# Patient Record
Sex: Male | Born: 1955 | Race: White | Hispanic: No | Marital: Married | Smoking: Current every day smoker
Health system: Southern US, Community
[De-identification: ages and names within clinical notes are randomized; demographics above are authoritative.]

## PROBLEM LIST (undated history)

## (undated) DIAGNOSIS — M549 Dorsalgia, unspecified: Secondary | ICD-10-CM

## (undated) DIAGNOSIS — K5790 Diverticulosis of intestine, part unspecified, without perforation or abscess without bleeding: Secondary | ICD-10-CM

## (undated) DIAGNOSIS — G8929 Other chronic pain: Secondary | ICD-10-CM

## (undated) DIAGNOSIS — E785 Hyperlipidemia, unspecified: Secondary | ICD-10-CM

## (undated) DIAGNOSIS — M5417 Radiculopathy, lumbosacral region: Secondary | ICD-10-CM

## (undated) DIAGNOSIS — I10 Essential (primary) hypertension: Secondary | ICD-10-CM

## (undated) DIAGNOSIS — N400 Enlarged prostate without lower urinary tract symptoms: Secondary | ICD-10-CM

## (undated) DIAGNOSIS — M4800 Spinal stenosis, site unspecified: Secondary | ICD-10-CM

## (undated) DIAGNOSIS — Z87891 Personal history of nicotine dependence: Secondary | ICD-10-CM

## (undated) DIAGNOSIS — E119 Type 2 diabetes mellitus without complications: Secondary | ICD-10-CM

## (undated) HISTORY — DX: Spinal stenosis, site unspecified: M48.00

## (undated) HISTORY — DX: Radiculopathy, lumbosacral region: M54.17

## (undated) HISTORY — DX: Hyperlipidemia, unspecified: E78.5

## (undated) HISTORY — DX: Benign prostatic hyperplasia without lower urinary tract symptoms: N40.0

## (undated) HISTORY — DX: Diverticulosis of intestine, part unspecified, without perforation or abscess without bleeding: K57.90

## (undated) HISTORY — DX: Personal history of nicotine dependence: Z87.891

## (undated) HISTORY — DX: Essential (primary) hypertension: I10

## (undated) HISTORY — PX: COLONOSCOPY: SHX174

## (undated) HISTORY — DX: Other chronic pain: G89.29

## (undated) HISTORY — DX: Dorsalgia, unspecified: M54.9

## (undated) HISTORY — DX: Type 2 diabetes mellitus without complications: E11.9

---

## 1996-08-10 DIAGNOSIS — I10 Essential (primary) hypertension: Secondary | ICD-10-CM

## 1996-08-10 DIAGNOSIS — R030 Elevated blood-pressure reading, without diagnosis of hypertension: Secondary | ICD-10-CM

## 1996-08-10 HISTORY — DX: Essential (primary) hypertension: I10

## 1998-04-11 DIAGNOSIS — E119 Type 2 diabetes mellitus without complications: Secondary | ICD-10-CM

## 1998-04-11 DIAGNOSIS — E1169 Type 2 diabetes mellitus with other specified complication: Secondary | ICD-10-CM | POA: Insufficient documentation

## 1998-04-11 DIAGNOSIS — E1165 Type 2 diabetes mellitus with hyperglycemia: Secondary | ICD-10-CM

## 1998-04-11 HISTORY — DX: Type 2 diabetes mellitus without complications: E11.9

## 1998-04-26 ENCOUNTER — Encounter: Payer: Self-pay | Admitting: Family Medicine

## 1998-05-10 ENCOUNTER — Encounter: Payer: Self-pay | Admitting: Family Medicine

## 1998-05-10 LAB — CONVERTED CEMR LAB: Hgb A1c MFr Bld: 7.1 %

## 1999-09-11 ENCOUNTER — Encounter: Payer: Self-pay | Admitting: Family Medicine

## 1999-09-11 DIAGNOSIS — E785 Hyperlipidemia, unspecified: Secondary | ICD-10-CM

## 1999-09-11 HISTORY — DX: Hyperlipidemia, unspecified: E78.5

## 1999-09-11 LAB — CONVERTED CEMR LAB: Microalbumin U total vol: 20.5 mg/L

## 1999-12-12 ENCOUNTER — Encounter: Payer: Self-pay | Admitting: Family Medicine

## 2001-05-11 ENCOUNTER — Encounter: Payer: Self-pay | Admitting: Family Medicine

## 2001-07-11 ENCOUNTER — Encounter: Payer: Self-pay | Admitting: Family Medicine

## 2001-07-11 LAB — CONVERTED CEMR LAB: Microalbumin U total vol: 10.6 mg/L

## 2001-10-11 ENCOUNTER — Encounter: Payer: Self-pay | Admitting: Family Medicine

## 2001-10-11 LAB — CONVERTED CEMR LAB: Hgb A1c MFr Bld: 5.5 %

## 2004-10-28 ENCOUNTER — Ambulatory Visit: Payer: Self-pay | Admitting: Family Medicine

## 2006-04-22 ENCOUNTER — Ambulatory Visit: Payer: Self-pay | Admitting: Family Medicine

## 2006-04-23 ENCOUNTER — Encounter: Payer: Self-pay | Admitting: Family Medicine

## 2006-04-23 LAB — CONVERTED CEMR LAB
ALT: 25 units/L (ref 0–40)
AST: 18 units/L (ref 0–37)
Albumin: 4.1 g/dL (ref 3.5–5.2)
Alkaline Phosphatase: 79 units/L (ref 39–117)
BUN: 12 mg/dL (ref 6–23)
Basophils Absolute: 0 10*3/uL (ref 0.0–0.1)
Basophils Relative: 0 % (ref 0.0–1.0)
Bilirubin, Direct: 0.1 mg/dL (ref 0.0–0.3)
CO2: 30 meq/L (ref 19–32)
Calcium: 9.4 mg/dL (ref 8.4–10.5)
Chloride: 102 meq/L (ref 96–112)
Cholesterol: 239 mg/dL (ref 0–200)
Creatinine, Ser: 0.8 mg/dL (ref 0.4–1.5)
Creatinine,U: 150.4 mg/dL
Direct LDL: 170.6 mg/dL
Eosinophils Absolute: 0.3 10*3/uL (ref 0.0–0.6)
Eosinophils Relative: 4.1 % (ref 0.0–5.0)
GFR calc Af Amer: 133 mL/min
GFR calc non Af Amer: 110 mL/min
Glucose, Bld: 197 mg/dL — ABNORMAL HIGH (ref 70–99)
HCT: 46.6 % (ref 39.0–52.0)
HDL: 45.7 mg/dL (ref >39.0)
Hemoglobin: 16.2 g/dL (ref 13.0–17.0)
Hgb A1c MFr Bld: 10.5 %
Hgb A1c MFr Bld: 10.5 % — ABNORMAL HIGH (ref 4.6–6.0)
Lymphocytes Relative: 23.1 % (ref 12.0–46.0)
MCHC: 34.7 g/dL (ref 30.0–36.0)
MCV: 88.4 fL (ref 78.0–100.0)
Microalb Creat Ratio: 55.9 mg/g — ABNORMAL HIGH (ref 0.0–30.0)
Microalb, Ur: 8.4 mg/dL — ABNORMAL HIGH (ref 0.0–1.9)
Monocytes Absolute: 0.4 10*3/uL (ref 0.2–0.7)
Monocytes Relative: 5.7 % (ref 3.0–11.0)
Neutro Abs: 5.3 10*3/uL (ref 1.4–7.7)
Neutrophils Relative %: 67.1 % (ref 43.0–77.0)
Platelets: 308 10*3/uL (ref 150–400)
Potassium: 4.1 meq/L (ref 3.5–5.1)
RBC: 5.28 M/uL (ref 4.22–5.81)
RDW: 12 % (ref 11.5–14.6)
Sodium: 140 meq/L (ref 135–145)
TSH: 1.42 microintl units/mL (ref 0.35–5.50)
Total Bilirubin: 0.7 mg/dL (ref 0.3–1.2)
Total CHOL/HDL Ratio: 5.2
Total Protein: 7.3 g/dL (ref 6.0–8.3)
Triglycerides: 117 mg/dL (ref 0–149)
VLDL: 23 mg/dL (ref 0–40)
WBC: 7.8 10*3/uL (ref 4.5–10.5)

## 2006-05-29 ENCOUNTER — Ambulatory Visit: Payer: Self-pay | Admitting: Family Medicine

## 2006-06-12 ENCOUNTER — Encounter: Payer: Self-pay | Admitting: Family Medicine

## 2006-06-19 DIAGNOSIS — H5310 Unspecified subjective visual disturbances: Secondary | ICD-10-CM | POA: Insufficient documentation

## 2006-07-24 ENCOUNTER — Telehealth (INDEPENDENT_AMBULATORY_CARE_PROVIDER_SITE_OTHER): Payer: Self-pay | Admitting: *Deleted

## 2006-07-29 ENCOUNTER — Ambulatory Visit: Payer: Self-pay | Admitting: Family Medicine

## 2006-07-29 ENCOUNTER — Encounter (INDEPENDENT_AMBULATORY_CARE_PROVIDER_SITE_OTHER): Payer: Self-pay | Admitting: Internal Medicine

## 2006-08-06 ENCOUNTER — Telehealth (INDEPENDENT_AMBULATORY_CARE_PROVIDER_SITE_OTHER): Payer: Self-pay | Admitting: Internal Medicine

## 2006-08-06 LAB — CONVERTED CEMR LAB
AST: 20 units/L (ref 0–37)
Cholesterol: 164 mg/dL (ref 0–200)
HDL: 46.8 mg/dL (ref >39.0)

## 2006-09-17 ENCOUNTER — Ambulatory Visit: Payer: Self-pay | Admitting: Family Medicine

## 2006-09-29 ENCOUNTER — Ambulatory Visit: Payer: Self-pay | Admitting: Family Medicine

## 2006-10-07 LAB — CONVERTED CEMR LAB: Hgb A1c MFr Bld: 8.8 % — ABNORMAL HIGH (ref 4.6–6.0)

## 2006-10-14 ENCOUNTER — Telehealth (INDEPENDENT_AMBULATORY_CARE_PROVIDER_SITE_OTHER): Payer: Self-pay | Admitting: *Deleted

## 2006-12-11 ENCOUNTER — Ambulatory Visit: Payer: Self-pay | Admitting: Family Medicine

## 2006-12-11 DIAGNOSIS — R635 Abnormal weight gain: Secondary | ICD-10-CM | POA: Insufficient documentation

## 2007-01-11 ENCOUNTER — Encounter (INDEPENDENT_AMBULATORY_CARE_PROVIDER_SITE_OTHER): Payer: Self-pay | Admitting: *Deleted

## 2007-07-06 ENCOUNTER — Telehealth (INDEPENDENT_AMBULATORY_CARE_PROVIDER_SITE_OTHER): Payer: Self-pay | Admitting: Internal Medicine

## 2007-09-23 ENCOUNTER — Telehealth (INDEPENDENT_AMBULATORY_CARE_PROVIDER_SITE_OTHER): Payer: Self-pay | Admitting: Internal Medicine

## 2007-10-12 ENCOUNTER — Ambulatory Visit: Payer: Self-pay | Admitting: Family Medicine

## 2007-10-22 ENCOUNTER — Ambulatory Visit: Payer: Self-pay | Admitting: Family Medicine

## 2007-10-26 ENCOUNTER — Encounter (INDEPENDENT_AMBULATORY_CARE_PROVIDER_SITE_OTHER): Payer: Self-pay | Admitting: Internal Medicine

## 2007-10-26 LAB — CONVERTED CEMR LAB
ALT: 31 units/L (ref 0–53)
AST: 22 units/L (ref 0–37)
BUN: 12 mg/dL (ref 6–23)
CO2: 31 meq/L (ref 19–32)
Calcium: 9.4 mg/dL (ref 8.4–10.5)
Creatinine, Ser: 0.9 mg/dL (ref 0.4–1.5)
Glucose, Bld: 152 mg/dL — ABNORMAL HIGH (ref 70–99)
HDL: 37 mg/dL — ABNORMAL LOW (ref >39.0)
Microalb, Ur: 1.1 mg/dL (ref 0.0–1.9)
TSH: 2.4 microintl units/mL (ref 0.35–5.50)
Triglycerides: 59 mg/dL (ref 0–149)

## 2007-11-23 ENCOUNTER — Ambulatory Visit: Payer: Self-pay | Admitting: Family Medicine

## 2008-02-11 DIAGNOSIS — G8929 Other chronic pain: Secondary | ICD-10-CM

## 2008-02-11 HISTORY — PX: OTHER SURGICAL HISTORY: SHX169

## 2008-02-11 HISTORY — DX: Other chronic pain: G89.29

## 2008-02-23 ENCOUNTER — Ambulatory Visit: Payer: Self-pay | Admitting: Family Medicine

## 2008-02-23 DIAGNOSIS — G8929 Other chronic pain: Secondary | ICD-10-CM | POA: Insufficient documentation

## 2008-02-23 DIAGNOSIS — IMO0002 Reserved for concepts with insufficient information to code with codable children: Secondary | ICD-10-CM

## 2008-03-02 ENCOUNTER — Encounter: Payer: Self-pay | Admitting: Family Medicine

## 2008-03-07 ENCOUNTER — Ambulatory Visit: Payer: Self-pay | Admitting: Family Medicine

## 2008-03-31 ENCOUNTER — Ambulatory Visit: Payer: Self-pay | Admitting: Family Medicine

## 2008-04-07 ENCOUNTER — Encounter (INDEPENDENT_AMBULATORY_CARE_PROVIDER_SITE_OTHER): Payer: Self-pay | Admitting: Internal Medicine

## 2008-04-07 ENCOUNTER — Encounter: Payer: Self-pay | Admitting: Family Medicine

## 2008-04-11 ENCOUNTER — Encounter: Payer: Self-pay | Admitting: Family Medicine

## 2008-04-24 ENCOUNTER — Encounter: Payer: Self-pay | Admitting: Family Medicine

## 2008-04-25 ENCOUNTER — Encounter (INDEPENDENT_AMBULATORY_CARE_PROVIDER_SITE_OTHER): Payer: Self-pay | Admitting: *Deleted

## 2008-04-28 ENCOUNTER — Encounter: Payer: Self-pay | Admitting: Family Medicine

## 2008-05-05 ENCOUNTER — Encounter: Payer: Self-pay | Admitting: Family Medicine

## 2008-05-10 ENCOUNTER — Encounter: Payer: Self-pay | Admitting: Family Medicine

## 2008-05-18 ENCOUNTER — Encounter: Payer: Self-pay | Admitting: Family Medicine

## 2008-05-19 ENCOUNTER — Encounter: Payer: Self-pay | Admitting: Family Medicine

## 2008-05-29 ENCOUNTER — Encounter: Payer: Self-pay | Admitting: Family Medicine

## 2008-06-09 ENCOUNTER — Telehealth (INDEPENDENT_AMBULATORY_CARE_PROVIDER_SITE_OTHER): Payer: Self-pay | Admitting: *Deleted

## 2008-07-03 ENCOUNTER — Encounter (INDEPENDENT_AMBULATORY_CARE_PROVIDER_SITE_OTHER): Payer: Self-pay | Admitting: Internal Medicine

## 2008-07-04 ENCOUNTER — Telehealth (INDEPENDENT_AMBULATORY_CARE_PROVIDER_SITE_OTHER): Payer: Self-pay | Admitting: Internal Medicine

## 2008-07-18 ENCOUNTER — Encounter (INDEPENDENT_AMBULATORY_CARE_PROVIDER_SITE_OTHER): Payer: Self-pay | Admitting: Internal Medicine

## 2008-07-18 ENCOUNTER — Ambulatory Visit: Payer: Self-pay | Admitting: Family Medicine

## 2008-07-18 DIAGNOSIS — H6120 Impacted cerumen, unspecified ear: Secondary | ICD-10-CM

## 2008-07-18 LAB — CONVERTED CEMR LAB
Glucose, Urine, Semiquant: NEGATIVE
WBC Urine, dipstick: NEGATIVE
pH: 5

## 2008-07-28 ENCOUNTER — Encounter: Payer: Self-pay | Admitting: Family Medicine

## 2008-07-28 LAB — CONVERTED CEMR LAB
CO2: 30 meq/L (ref 19–32)
Chloride: 107 meq/L (ref 96–112)
Creatinine, Ser: 0.8 mg/dL (ref 0.4–1.5)
HDL: 53.6 mg/dL (ref >39.00)
LDL Cholesterol: 63 mg/dL (ref 0–99)
Microalb Creat Ratio: 12.7 mg/g (ref 0.0–30.0)
TSH: 1.56 microintl units/mL (ref 0.35–5.50)
Total CHOL/HDL Ratio: 2
Triglycerides: 62 mg/dL (ref 0.0–149.0)
VLDL: 12.4 mg/dL (ref 0.0–40.0)

## 2008-08-02 ENCOUNTER — Ambulatory Visit: Payer: Self-pay | Admitting: Family Medicine

## 2008-10-02 ENCOUNTER — Ambulatory Visit: Payer: Self-pay | Admitting: Family Medicine

## 2008-10-03 ENCOUNTER — Encounter (INDEPENDENT_AMBULATORY_CARE_PROVIDER_SITE_OTHER): Payer: Self-pay | Admitting: Internal Medicine

## 2009-01-08 ENCOUNTER — Encounter (INDEPENDENT_AMBULATORY_CARE_PROVIDER_SITE_OTHER): Payer: Self-pay | Admitting: Internal Medicine

## 2009-01-19 ENCOUNTER — Ambulatory Visit: Payer: Self-pay | Admitting: Family Medicine

## 2009-01-24 LAB — CONVERTED CEMR LAB
HDL: 44.7 mg/dL (ref >39.00)
LDL Cholesterol: 69 mg/dL (ref 0–99)
Total CHOL/HDL Ratio: 3
Triglycerides: 62 mg/dL (ref 0.0–149.0)
VLDL: 12.4 mg/dL (ref 0.0–40.0)

## 2009-07-27 ENCOUNTER — Ambulatory Visit: Payer: Self-pay | Admitting: Family Medicine

## 2009-07-30 LAB — CONVERTED CEMR LAB
Cholesterol: 152 mg/dL (ref 0–200)
HDL: 51.6 mg/dL (ref >39.00)
Microalb Creat Ratio: 0.9 mg/g (ref 0.0–30.0)
Triglycerides: 54 mg/dL (ref 0.0–149.0)
VLDL: 10.8 mg/dL (ref 0.0–40.0)

## 2009-10-11 ENCOUNTER — Encounter (INDEPENDENT_AMBULATORY_CARE_PROVIDER_SITE_OTHER): Payer: Self-pay | Admitting: *Deleted

## 2009-11-20 ENCOUNTER — Ambulatory Visit: Payer: Self-pay | Admitting: Internal Medicine

## 2009-11-20 DIAGNOSIS — F17200 Nicotine dependence, unspecified, uncomplicated: Secondary | ICD-10-CM

## 2009-11-20 LAB — CONVERTED CEMR LAB
HDL goal, serum: 40 mg/dL
LDL Goal: 100 mg/dL

## 2010-02-20 ENCOUNTER — Other Ambulatory Visit: Payer: Self-pay | Admitting: Family Medicine

## 2010-02-20 ENCOUNTER — Ambulatory Visit
Admission: RE | Admit: 2010-02-20 | Discharge: 2010-02-20 | Payer: Self-pay | Source: Home / Self Care | Attending: Family Medicine | Admitting: Family Medicine

## 2010-02-20 LAB — HEPATIC FUNCTION PANEL
ALT: 29 U/L (ref 0–53)
AST: 22 U/L (ref 0–37)
Albumin: 4 g/dL (ref 3.5–5.2)
Alkaline Phosphatase: 59 U/L (ref 39–117)
Bilirubin, Direct: 0.1 mg/dL (ref 0.0–0.3)
Total Bilirubin: 0.8 mg/dL (ref 0.3–1.2)
Total Protein: 6.8 g/dL (ref 6.0–8.3)

## 2010-02-20 LAB — LDL CHOLESTEROL, DIRECT: Direct LDL: 122.6 mg/dL

## 2010-02-20 LAB — BASIC METABOLIC PANEL
BUN: 12 mg/dL (ref 6–23)
CO2: 29 mEq/L (ref 19–32)
Calcium: 9.5 mg/dL (ref 8.4–10.5)
Chloride: 105 mEq/L (ref 96–112)
Creatinine, Ser: 0.8 mg/dL (ref 0.4–1.5)
GFR: 116.16 mL/min (ref >60.00)
Glucose, Bld: 146 mg/dL — ABNORMAL HIGH (ref 70–99)
Potassium: 5.3 mEq/L — ABNORMAL HIGH (ref 3.5–5.1)
Sodium: 141 mEq/L (ref 135–145)

## 2010-02-20 LAB — HEMOGLOBIN A1C: Hgb A1c MFr Bld: 6.7 % — ABNORMAL HIGH (ref 4.6–6.5)

## 2010-02-26 ENCOUNTER — Other Ambulatory Visit: Payer: Self-pay | Admitting: Family Medicine

## 2010-02-26 ENCOUNTER — Ambulatory Visit
Admission: RE | Admit: 2010-02-26 | Discharge: 2010-02-26 | Payer: Self-pay | Source: Home / Self Care | Attending: Family Medicine | Admitting: Family Medicine

## 2010-02-26 DIAGNOSIS — E875 Hyperkalemia: Secondary | ICD-10-CM | POA: Insufficient documentation

## 2010-02-26 LAB — POTASSIUM: Potassium: 5.4 mEq/L — ABNORMAL HIGH (ref 3.5–5.1)

## 2010-03-07 ENCOUNTER — Other Ambulatory Visit: Payer: Self-pay | Admitting: Family Medicine

## 2010-03-07 ENCOUNTER — Ambulatory Visit
Admission: RE | Admit: 2010-03-07 | Discharge: 2010-03-07 | Payer: Self-pay | Source: Home / Self Care | Attending: Family Medicine | Admitting: Family Medicine

## 2010-03-07 ENCOUNTER — Encounter: Payer: Self-pay | Admitting: Family Medicine

## 2010-03-07 DIAGNOSIS — N400 Enlarged prostate without lower urinary tract symptoms: Secondary | ICD-10-CM | POA: Insufficient documentation

## 2010-03-07 LAB — BASIC METABOLIC PANEL
BUN: 14 mg/dL (ref 6–23)
CO2: 29 mEq/L (ref 19–32)
Calcium: 9.5 mg/dL (ref 8.4–10.5)
Chloride: 102 mEq/L (ref 96–112)
Creatinine, Ser: 0.9 mg/dL (ref 0.4–1.5)
GFR: 92.91 mL/min (ref >60.00)
Glucose, Bld: 129 mg/dL — ABNORMAL HIGH (ref 70–99)
Potassium: 5.1 mEq/L (ref 3.5–5.1)
Sodium: 137 mEq/L (ref 135–145)

## 2010-03-07 LAB — PSA: PSA: 0.59 ng/mL (ref 0.10–4.00)

## 2010-03-07 LAB — HM DIABETES FOOT EXAM
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
## 2010-03-13 NOTE — Miscellaneous (Signed)
Summary: med list update- test strips  Clinical Lists Changes  Medications: Changed medication from FREESTYLE LITE TEST  STRP (GLUCOSE BLOOD) check blood glucose one to two times a day to [facility] ULTRA TEST  STRP (GLUCOSE BLOOD) check blood sugar one to two times daily     Prior Medications: ZOCOR 20 MG  TABS (SIMVASTATIN) Take 1 tablet by mouth once a day ONETOUCH ULTRA TEST  STRP (GLUCOSE BLOOD) check blood sugar one to two times daily ACTOPLUS MET 15-850 MG TABS (PIOGLITAZONE HCL-METFORMIN HCL) 1 tab two times a day for diabetes by mouth ALEVE 220 MG TABS (NAPROXEN SODIUM) OTC as directed TOPAMAX 25 MG TABS (TOPIRAMATE) one in AM and two in PM JANUVIA 100 MG TABS (SITAGLIPTIN PHOSPHATE) 1 once daily for DM Current Allergies: No known allergies

## 2010-03-14 ENCOUNTER — Other Ambulatory Visit: Payer: Self-pay

## 2010-03-14 ENCOUNTER — Encounter (INDEPENDENT_AMBULATORY_CARE_PROVIDER_SITE_OTHER): Payer: Self-pay | Admitting: *Deleted

## 2010-03-14 ENCOUNTER — Other Ambulatory Visit: Payer: Self-pay | Admitting: Family Medicine

## 2010-03-14 DIAGNOSIS — Z1211 Encounter for screening for malignant neoplasm of colon: Secondary | ICD-10-CM

## 2010-03-14 LAB — FECAL OCCULT BLOOD, IMMUNOCHEMICAL: Fecal Occult Bld: NEGATIVE

## 2010-03-14 LAB — FECAL OCCULT BLOOD, GUAIAC: Fecal Occult Blood: NEGATIVE

## 2010-03-14 NOTE — Letter (Signed)
Summary: [facility]: Immunoassay Fecal Occult Blood (iFOB) Order Form  [place] [facility]  [address] [place], [place] [address]   Phone: [phone]  Fax: [phone]      [facility]: Immunoassay Fecal Occult Blood (iFOB) Order Form   March 07, 2010 MRN: [id number]   Logan Tanner 1956-01-13   Physicican Name:_________________________  Diagnosis Code:__________________________      Logan Boyden  MD
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
## 2010-03-15 ENCOUNTER — Encounter (INDEPENDENT_AMBULATORY_CARE_PROVIDER_SITE_OTHER): Payer: Self-pay | Admitting: *Deleted

## 2010-03-20 ENCOUNTER — Encounter: Payer: Self-pay | Admitting: Family Medicine

## 2010-03-20 NOTE — Miscellaneous (Signed)
Summary: Hemoccult to flowsheet  Clinical Lists Changes  Observations: Added new observation of HEMOCULTDUE: 03/2011 (03/15/2010 8:22) Added new observation of HEMOCCULT: negative (03/14/2010 8:23)      Preventive Care Screening  Hemoccult:    Date:  03/14/2010    Next Due:  03/2011    Results:  negative

## 2010-03-20 NOTE — Letter (Signed)
Summary: Results Follow up Letter  [place] [facility]  [address] [place], [place] [address]   Phone: [phone]  Fax: [phone]    03/15/2010 MRN: [id number]  Logan Tanner [address] [place], [place]  [address]  [place]  Dear Mr. PUROHIT,  The following are the results of your recent test(s):  Test         Result    Pap Smear:        Normal _____  Not Normal _____ Comments: ______________________________________________________ Cholesterol: LDL(Bad cholesterol):         Your goal is less than:         HDL (Good cholesterol):       Your goal is more than: Comments:  ______________________________________________________ Mammogram:        Normal _____  Not Normal _____ Comments:  ___________________________________________________________________ Hemoccult:        Normal __X___  Not normal _______ Comments:  Repeat in 1 year.  _____________________________________________________________________ Other Tests:    We routinely do not discuss normal results over the telephone.  If you desire a copy of the results, or you have any questions about this information we can discuss them at your next office visit.   Sincerely,       Dr. Eustaquio Boyden
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
## 2010-06-18 ENCOUNTER — Other Ambulatory Visit: Payer: Self-pay | Admitting: Neurosurgery

## 2010-06-18 DIAGNOSIS — M47816 Spondylosis without myelopathy or radiculopathy, lumbar region: Secondary | ICD-10-CM

## 2010-06-21 ENCOUNTER — Ambulatory Visit
Admission: RE | Admit: 2010-06-21 | Discharge: 2010-06-21 | Disposition: A | Discharge status: Home / Self Care | Payer: Worker's Compensation | Source: Ambulatory Visit | Attending: Neurosurgery | Admitting: Neurosurgery

## 2010-06-21 DIAGNOSIS — M47816 Spondylosis without myelopathy or radiculopathy, lumbar region: Secondary | ICD-10-CM

## 2010-07-29 ENCOUNTER — Other Ambulatory Visit: Payer: Self-pay | Admitting: *Deleted

## 2010-07-29 MED ORDER — PIOGLITAZONE HCL-METFORMIN HCL 15-850 MG PO TABS: 1.0000 | ORAL_TABLET | Freq: Two times a day (BID) | ORAL | Status: DC

## 2010-09-02 ENCOUNTER — Encounter: Payer: Self-pay | Admitting: Family Medicine

## 2010-09-03 ENCOUNTER — Encounter: Payer: Self-pay | Admitting: Family Medicine

## 2010-09-03 ENCOUNTER — Ambulatory Visit (INDEPENDENT_AMBULATORY_CARE_PROVIDER_SITE_OTHER): Payer: 59 | Admitting: Family Medicine

## 2010-09-03 VITALS — BP 116/78 | HR 84 | Temp 98.3°F | Wt 205.8 lb

## 2010-09-03 DIAGNOSIS — E785 Hyperlipidemia, unspecified: Secondary | ICD-10-CM

## 2010-09-03 DIAGNOSIS — H5310 Unspecified subjective visual disturbances: Secondary | ICD-10-CM

## 2010-09-03 DIAGNOSIS — E119 Type 2 diabetes mellitus without complications: Secondary | ICD-10-CM

## 2010-09-03 DIAGNOSIS — F17200 Nicotine dependence, unspecified, uncomplicated: Secondary | ICD-10-CM

## 2010-09-03 DIAGNOSIS — I10 Essential (primary) hypertension: Secondary | ICD-10-CM

## 2010-09-03 DIAGNOSIS — H6120 Impacted cerumen, unspecified ear: Secondary | ICD-10-CM

## 2010-09-03 LAB — MICROALBUMIN / CREATININE URINE RATIO: Microalb Creat Ratio: 2.2 mg/g (ref 0.0–30.0)

## 2010-09-03 NOTE — Assessment & Plan Note (Signed)
Last check LDL 120s, not at goal <100 for diabetes. Recheck today. Compliant and tolerant on statin.

## 2010-09-03 NOTE — Progress Notes (Signed)
  Subjective:    Patient ID: Logan Tanner, male    DOB: [birth year], 55 y.o.   MRN: [id number]  HPI CC: 6 mo f/u  1. DM - sugars doing ok, denies hypoglycemic sxs.  90-130 fasting.  Due for vision screen.  Foot exam today.  2. Elevated BP - elevated in past.  Not recently.  Not on meds.  Doesn't add salt to foods.    3. HLD - compliant with zocor, no myalgias. Wt Readings from Last 3 Encounters:  09/03/10 205 lb 12 oz (93.328 kg)  03/07/10 207 lb 12 oz (94.235 kg)  11/20/09 203 lb 8 oz (92.307 kg)   4. Smoking - 1/2 ppd.  Working on cutting back.  Considering trying e-cigarette.  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  Had coffee with cream and sugar this am.  Review of Systems Per HPI    Objective:   Physical Exam  Nursing note and vitals reviewed. Constitutional: He appears well-developed and well-nourished. No distress.  HENT:  Head: Normocephalic and atraumatic.  Right Ear: External ear normal.  Left Ear: External ear normal.  Mouth/Throat: Oropharynx is clear and moist. No oropharyngeal exudate.       Cerumen covering both TMs  Eyes: Conjunctivae and EOM are normal. Pupils are equal, round, and reactive to light. No scleral icterus.  Neck: Normal range of motion. Neck supple. Carotid bruit is not present.  Cardiovascular: Normal rate, regular rhythm, normal heart sounds and intact distal pulses.   No murmur heard. Pulmonary/Chest: Effort normal and breath sounds normal. No respiratory distress. He has no wheezes. He has no rales.  Musculoskeletal: He exhibits no edema.  Lymphadenopathy:    He has no cervical adenopathy.  Skin: Skin is warm and dry. No rash noted.  Psychiatric: He has a normal mood and affect. His behavior is normal. Judgment and thought content normal.       Diabetic foot exam: Normal inspection No skin breakdown No calluses  Normal DP/PT pulses Normal sensation to light tough and monofilament Nails normal Assessment & Plan:

## 2010-09-03 NOTE — Assessment & Plan Note (Addendum)
Elevated in past, not currently. Removed HTN from problem list.

## 2010-09-03 NOTE — Patient Instructions (Signed)
Return at your convenience fasting for blood work. Vision and hearing checked today. Bring forms to fill out. Urine checked today. Call us with questions. Return in 6 months for recheck and/or physical.

## 2010-09-03 NOTE — Assessment & Plan Note (Signed)
Encouraged cessation.  Pt contemplative.

## 2010-09-03 NOTE — Assessment & Plan Note (Signed)
Good control recently.  Due for recheck A1c. Lab Results  Component Value Date   HGBA1C 6.7* 02/20/2010  continue meds.

## 2010-09-09 ENCOUNTER — Other Ambulatory Visit (INDEPENDENT_AMBULATORY_CARE_PROVIDER_SITE_OTHER): Payer: 59

## 2010-09-09 DIAGNOSIS — E119 Type 2 diabetes mellitus without complications: Secondary | ICD-10-CM

## 2010-09-09 DIAGNOSIS — E785 Hyperlipidemia, unspecified: Secondary | ICD-10-CM

## 2010-09-09 DIAGNOSIS — R319 Hematuria, unspecified: Secondary | ICD-10-CM

## 2010-09-09 LAB — BASIC METABOLIC PANEL
BUN: 17 mg/dL (ref 6–23)
Calcium: 9.3 mg/dL (ref 8.4–10.5)
GFR: 97.67 mL/min (ref >60.00)
Glucose, Bld: 134 mg/dL — ABNORMAL HIGH (ref 70–99)
Sodium: 142 mEq/L (ref 135–145)

## 2010-09-09 LAB — POCT URINALYSIS DIPSTICK
Nitrite, UA: NEGATIVE
Protein, UA: NEGATIVE
Urobilinogen, UA: 0.2
pH, UA: 6

## 2010-09-09 LAB — LIPID PANEL
Cholesterol: 132 mg/dL (ref 0–200)
HDL: 46.3 mg/dL (ref >39.00)
LDL Cholesterol: 69 mg/dL (ref 0–99)
VLDL: 16.6 mg/dL (ref 0.0–40.0)

## 2010-09-09 NOTE — Progress Notes (Signed)
Addended by: Josph Macho A on: 09/09/2010 10:05 AM   Modules accepted: Orders

## 2010-09-09 NOTE — Progress Notes (Signed)
Addended by: Alvina Chou on: 09/09/2010 11:23 AM   Modules accepted: Orders

## 2010-09-11 ENCOUNTER — Other Ambulatory Visit: Payer: Self-pay | Admitting: Family Medicine

## 2010-09-18 ENCOUNTER — Ambulatory Visit (INDEPENDENT_AMBULATORY_CARE_PROVIDER_SITE_OTHER): Payer: 59 | Admitting: Family Medicine

## 2010-09-18 DIAGNOSIS — Z0279 Encounter for issue of other medical certificate: Secondary | ICD-10-CM

## 2010-09-18 LAB — POCT URINALYSIS DIPSTICK
Bilirubin, UA: NEGATIVE
Blood, UA: NEGATIVE
Glucose, UA: NEGATIVE
Ketones, UA: NEGATIVE
Nitrite, UA: NEGATIVE
Spec Grav, UA: 1.02

## 2010-09-18 NOTE — Progress Notes (Signed)
Patient here for repeat vision screen and repeat UA for DOT physical.

## 2010-10-02 ENCOUNTER — Other Ambulatory Visit: Payer: Self-pay | Admitting: *Deleted

## 2010-10-02 MED ORDER — PIOGLITAZONE HCL-METFORMIN HCL 15-850 MG PO TABS: 1.0000 | ORAL_TABLET | Freq: Two times a day (BID) | ORAL | Status: DC

## 2010-11-04 ENCOUNTER — Other Ambulatory Visit: Payer: Self-pay | Admitting: *Deleted

## 2010-11-04 MED ORDER — SIMVASTATIN 20 MG PO TABS: 20.0000 mg | ORAL_TABLET | Freq: Every day | ORAL | Status: DC

## 2010-12-17 ENCOUNTER — Other Ambulatory Visit: Payer: Self-pay | Admitting: *Deleted

## 2010-12-17 ENCOUNTER — Encounter: Payer: Self-pay | Admitting: *Deleted

## 2010-12-17 MED ORDER — GLUCOSE BLOOD VI STRP: ORAL_STRIP | Status: DC

## 2010-12-17 NOTE — Telephone Encounter (Signed)
Received faxed refill request from pharmacy. Refill sent to pharmacy electronically. 

## 2011-02-10 ENCOUNTER — Other Ambulatory Visit: Payer: Self-pay | Admitting: *Deleted

## 2011-02-10 MED ORDER — PIOGLITAZONE HCL-METFORMIN HCL 15-850 MG PO TABS: 1.0000 | ORAL_TABLET | Freq: Two times a day (BID) | ORAL | Status: DC

## 2011-02-28 ENCOUNTER — Other Ambulatory Visit: Payer: Self-pay | Admitting: Family Medicine

## 2011-02-28 DIAGNOSIS — E119 Type 2 diabetes mellitus without complications: Secondary | ICD-10-CM

## 2011-02-28 DIAGNOSIS — Z125 Encounter for screening for malignant neoplasm of prostate: Secondary | ICD-10-CM

## 2011-02-28 DIAGNOSIS — E785 Hyperlipidemia, unspecified: Secondary | ICD-10-CM

## 2011-02-28 DIAGNOSIS — F17200 Nicotine dependence, unspecified, uncomplicated: Secondary | ICD-10-CM

## 2011-03-04 ENCOUNTER — Other Ambulatory Visit (INDEPENDENT_AMBULATORY_CARE_PROVIDER_SITE_OTHER): Payer: 59

## 2011-03-04 DIAGNOSIS — E785 Hyperlipidemia, unspecified: Secondary | ICD-10-CM

## 2011-03-04 DIAGNOSIS — Z125 Encounter for screening for malignant neoplasm of prostate: Secondary | ICD-10-CM

## 2011-03-04 DIAGNOSIS — E119 Type 2 diabetes mellitus without complications: Secondary | ICD-10-CM

## 2011-03-04 LAB — LIPID PANEL
HDL: 49.8 mg/dL (ref >39.00)
LDL Cholesterol: 98 mg/dL (ref 0–99)
Total CHOL/HDL Ratio: 3
Triglycerides: 82 mg/dL (ref 0.0–149.0)
VLDL: 16.4 mg/dL (ref 0.0–40.0)

## 2011-03-04 LAB — COMPREHENSIVE METABOLIC PANEL
ALT: 28 U/L (ref 0–53)
CO2: 27 mEq/L (ref 19–32)
Creatinine, Ser: 0.8 mg/dL (ref 0.4–1.5)
GFR: 110.58 mL/min (ref >60.00)
Total Bilirubin: 0.6 mg/dL (ref 0.3–1.2)

## 2011-03-10 ENCOUNTER — Ambulatory Visit (INDEPENDENT_AMBULATORY_CARE_PROVIDER_SITE_OTHER): Payer: 59 | Admitting: Family Medicine

## 2011-03-10 ENCOUNTER — Encounter: Payer: Self-pay | Admitting: Family Medicine

## 2011-03-10 VITALS — BP 126/82 | HR 80 | Temp 98.5°F | Ht 68.0 in | Wt 209.5 lb

## 2011-03-10 DIAGNOSIS — N400 Enlarged prostate without lower urinary tract symptoms: Secondary | ICD-10-CM

## 2011-03-10 DIAGNOSIS — Z87891 Personal history of nicotine dependence: Secondary | ICD-10-CM

## 2011-03-10 DIAGNOSIS — E785 Hyperlipidemia, unspecified: Secondary | ICD-10-CM

## 2011-03-10 DIAGNOSIS — B078 Other viral warts: Secondary | ICD-10-CM

## 2011-03-10 DIAGNOSIS — Z23 Encounter for immunization: Secondary | ICD-10-CM

## 2011-03-10 DIAGNOSIS — E119 Type 2 diabetes mellitus without complications: Secondary | ICD-10-CM

## 2011-03-10 DIAGNOSIS — R030 Elevated blood-pressure reading, without diagnosis of hypertension: Secondary | ICD-10-CM

## 2011-03-10 DIAGNOSIS — IMO0001 Reserved for inherently not codable concepts without codable children: Secondary | ICD-10-CM

## 2011-03-10 NOTE — Assessment & Plan Note (Addendum)
Discussed 5a reductase inhibitor and a blocker. Monitor for now. PSA stable.

## 2011-03-10 NOTE — Assessment & Plan Note (Signed)
No h/o HTN.

## 2011-03-10 NOTE — Assessment & Plan Note (Signed)
Chronic, stable. Good control continued. Lab Results  Component Value Date   HGBA1C 6.6* 03/04/2011  discussed.  rec schedule vision screen.  Pneumonia shot today.

## 2011-03-10 NOTE — Assessment & Plan Note (Signed)
Quit cold [place] 02/2011. Encouraged continued cessation.

## 2011-03-10 NOTE — Progress Notes (Signed)
  Subjective:    Patient ID: Logan Tanner, male    DOB: [birth year], 56 y.o.   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  Would like to move physical for August so both are on same month.  Today visit changed to chronic problem management.  Quit smoking - 3wks ago!  Cold [place].  Still having cravings.  Uses e cigs or chewing cigar.  Was at 1/3 ppd for 20 yrs.  No h/o HTN - No HA, vision changes, CP/tightness, SOB, leg swelling.   DM - has not been watching diet very regularly last few months.  Checks sugar regularly 3x/wk, no highs >200, no lows <70.  No vision screen recently.  Due for recheck, advised to schedule.  Foot exam today.  Compliant with actoplus met Lab Results  Component Value Date   HGBA1C 6.6* 03/04/2011   HLD - no myalgias.  Tolerates simvastatin  BPH - nocturia 2x/night.  Weakening stream.  Dribbles more in cold.  Not ready to start flomax  Also wants wart looked at.  Wife with medical problems, financial issues.  Current Outpatient Prescriptions on File Prior to Visit  Medication Sig Dispense Refill  . aspirin 81 MG tablet Take 81 mg by mouth daily.        Marland Kitchen glucose blood test strip Ultra test strip. Use to test sugar once to twice daily. Dx code 250.00  100 each  3  . naproxen sodium (ANAPROX) 220 MG tablet Take 220 mg by mouth as needed.       . pioglitazone-metformin (ACTOPLUS MET) 15-850 MG per tablet Take 1 tablet by mouth 2 (two) times daily with a meal.  60 tablet  3  . simvastatin (ZOCOR) 20 MG tablet Take 1 tablet (20 mg total) by mouth at bedtime.  30 tablet  3  . topiramate (TOPAMAX) 25 MG capsule Take 25 mg by mouth as directed. 1 in the AM and 2 in the PM         Review of Systems Per HPI    Objective:   Physical Exam  Vitals reviewed. Constitutional: He appears well-developed and well-nourished. No distress.  HENT:  Head: Normocephalic and atraumatic.  Mouth/Throat: Oropharynx is  clear and moist. No oropharyngeal exudate.  Eyes: Conjunctivae and EOM are normal. Pupils are equal, round, and reactive to light. No scleral icterus.  Neck: Normal range of motion. Neck supple. Carotid bruit is not present.  Cardiovascular: Normal rate, regular rhythm, normal heart sounds and intact distal pulses.   No murmur heard. Pulmonary/Chest: Effort normal and breath sounds normal. No respiratory distress. He has no wheezes. He has no rales.  Musculoskeletal: He exhibits no edema.       Diabetic foot exam: Normal inspection No skin breakdown No calluses  Normal DP/PT pulses Normal sensation to light touch and monofilament Nails normal  Skin: Skin is warm and dry. No rash noted.       Right palmar index finger between PIP and DIP with 2 hyperkeratotic papules, mild surrounding erythema       Assessment & Plan:

## 2011-03-10 NOTE — Assessment & Plan Note (Signed)
Chronic, controlled on simvastatin. Lab Results  Component Value Date   LDLCALC 98 03/04/2011

## 2011-03-10 NOTE — Assessment & Plan Note (Signed)
Monitor for now. Continue salicylic acid, bandaid, filing method

## 2011-03-10 NOTE — Patient Instructions (Addendum)
Return after 09/2011 for physical and repeat blood work. Good to see you today, call us with questions. Pneumonia shot today. Good job with quitting smoking!  Keep up the good work. Make eye doctor appointment.

## 2011-03-17 ENCOUNTER — Other Ambulatory Visit: Payer: Self-pay | Admitting: *Deleted

## 2011-03-17 MED ORDER — SIMVASTATIN 20 MG PO TABS: 20.0000 mg | ORAL_TABLET | Freq: Every day | ORAL | Status: DC

## 2011-06-15 IMAGING — CT CT L SPINE W/ CM
4 of 10 series · 12 of 33 positions shown, 14 images · IV contrast (omnipaque)
Comparison: none

. *RADIOLOGY REPORT*

MYELOGRAM INJECTION
TECHNIQUE: Informed consent was obtained from the patient prior to
the procedure, including potential complications of headache,
allergy, infection and pain. Specific instructions were given
regarding 24 hour bedrest post procedure to prevent post-LP
headache.  A timeout procedure was performed.  With the patient
prone, the lower back was prepped with Betadine.  1% Lidocaine was
used for local anesthesia.  Lumbar puncture was performed by the
radiologist at the L3-L4 level using a 22 gauge needle with return
of clear CSF.  15 cc of Omnipaque 180 was injected into the
subarachnoid space .
CLINICAL DATA: Low back and left leg pain.
TECHNIQUE: Multidetector CT imaging of the lumbar spine was
performed following myelography.  Multiplanar CT image
reconstructions were also generated.

[Series 2: l spine bone · axial · 0.27mm/px · z∈[+19,+96]mm · 2 of 94 slices shown, 3 images]
[im 32/94  soft-tissue]
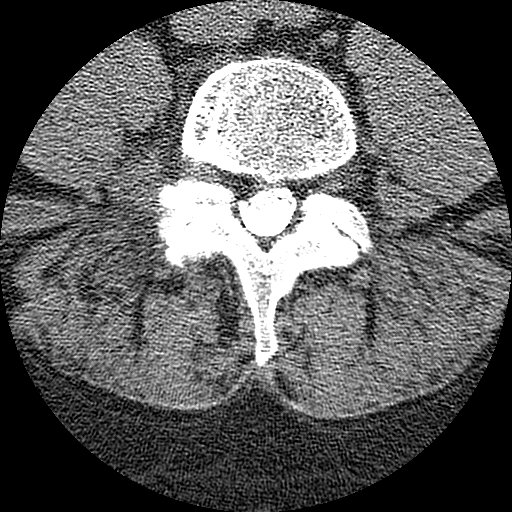
[im 32/94  bone]
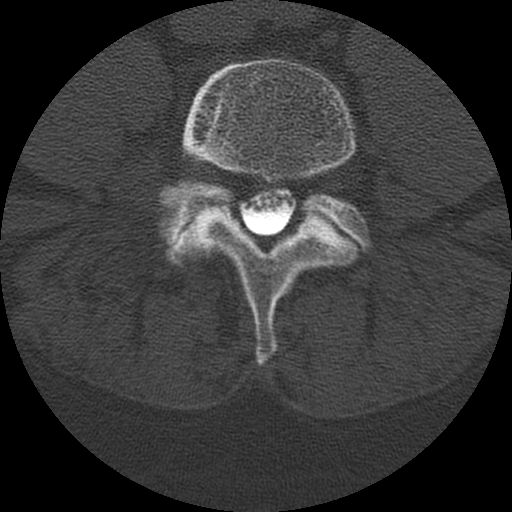
[im 63/94  bone]
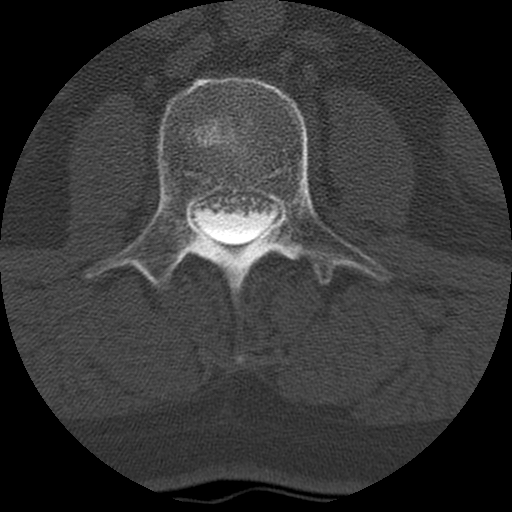

[Series 3: l spine soft · axial · 0.27mm/px · z∈[+19,+96]mm · 2 of 94 slices shown]
[im 32/94  soft-tissue]
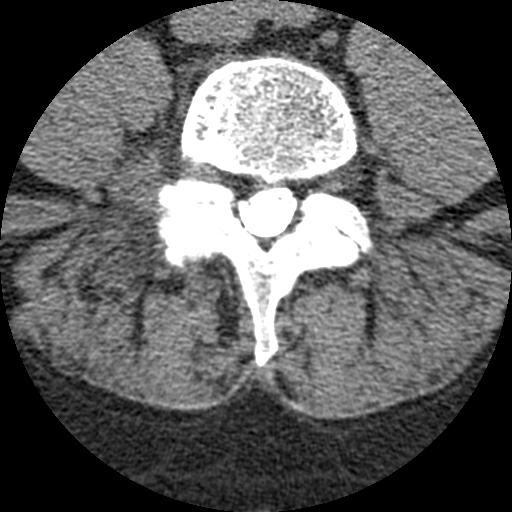
[im 63/94  soft-tissue]
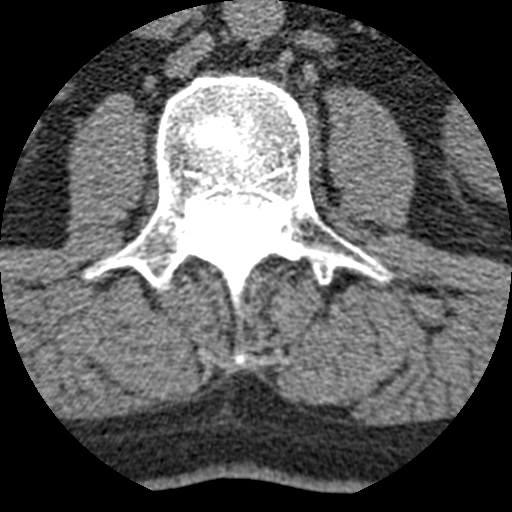

[Series 400: coronal · coronal · 0.47mm/px · 5 of 50 slices shown, 6 images]
[im 17/50  bone]
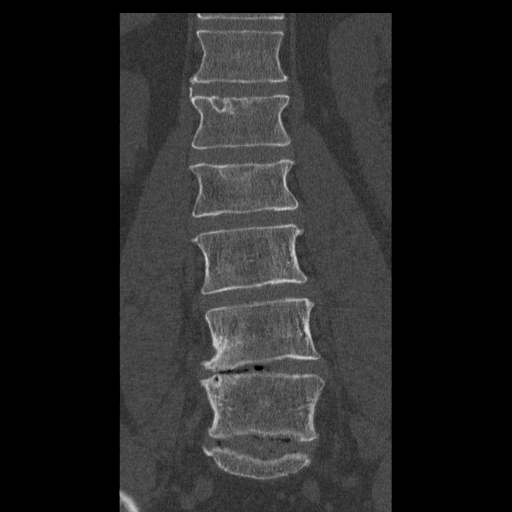
[im 21/50  bone]
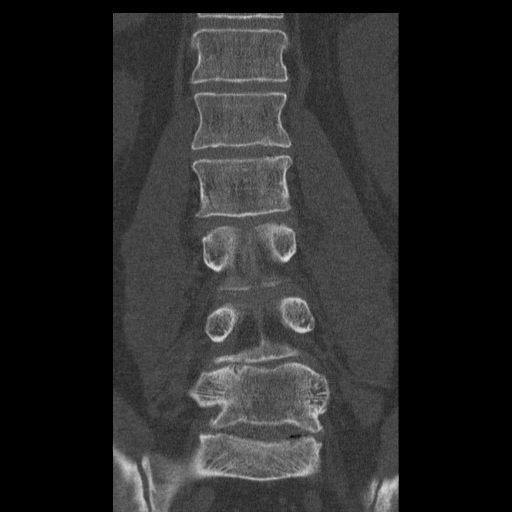
[im 25/50  soft-tissue]
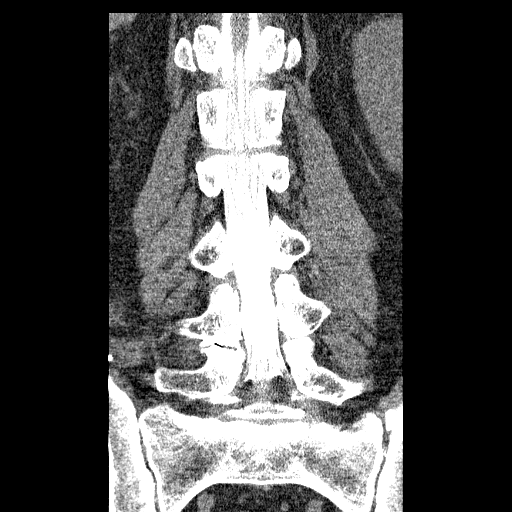
[im 25/50  bone]
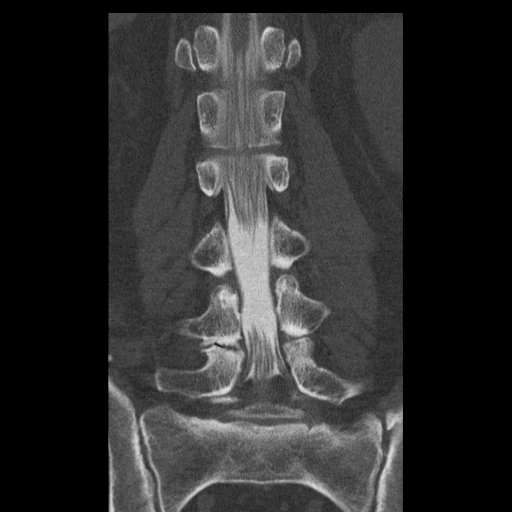
[im 29/50  bone]
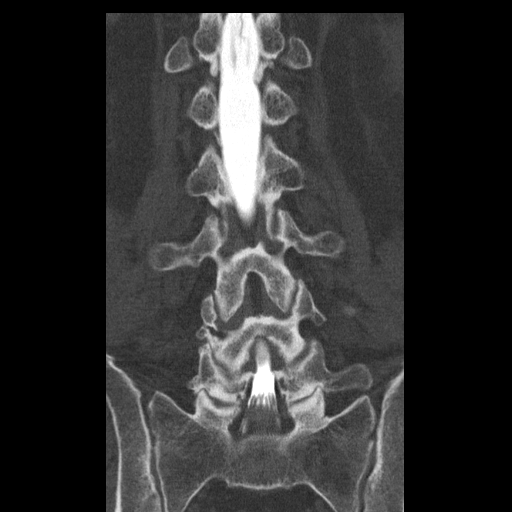
[im 33/50  bone]
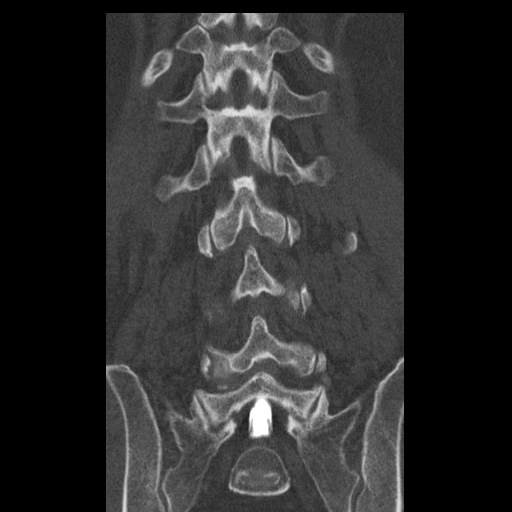

[Series 401: sagittal · sagittal · 0.47mm/px · 3 of 40 slices shown]
[im 8/40  bone]
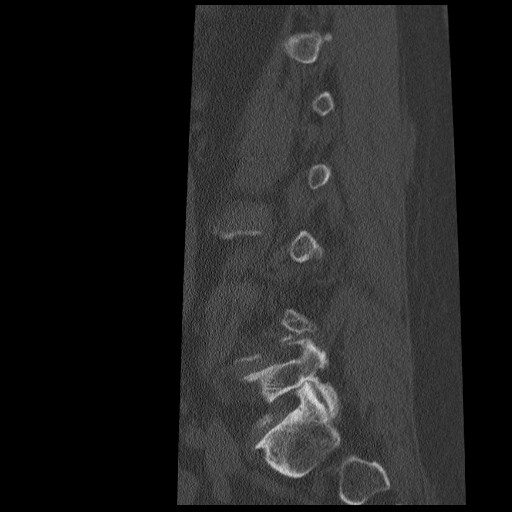
[im 16/40  bone]
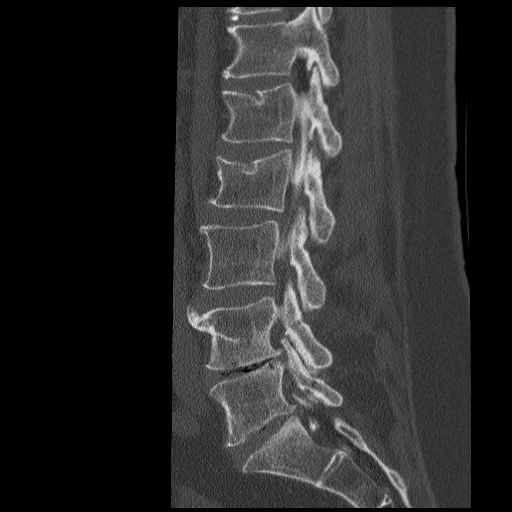
[im 24/40  bone]
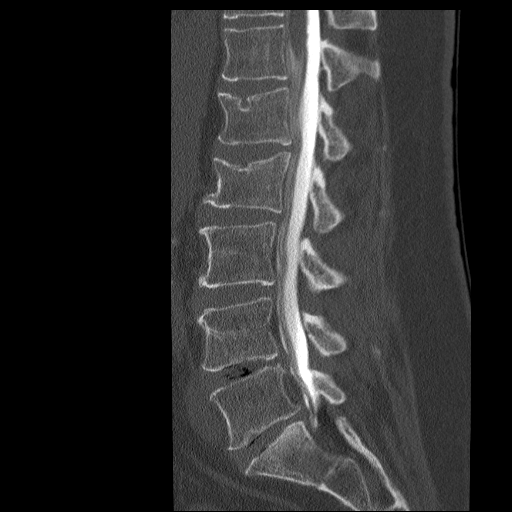

[12 of 33 positions shown; findings below may reference images not displayed]

IMPRESSION: Successful injection of  intrathecal contrast for myelography.

MYELOGRAM LUMBAR
FINDINGS: Good opacification lumbar subarachnoid space.  Asymmetric
loss of interspace height at L4-5 on the right.  Mild disc space
narrowing L5-S1.  Schmorl's nodes result in end plate depression
superiorly at L1 and L2.  Extradural defects on the right are seen
at L4-5 and L5-S1 affecting the right L5 and right S1 nerve roots
respectively.  There is no significant left sided nerve root
encroachment.  Mild central canal stenosis is evident at L5-S1 and
L4-5.

With the patient standing, the ventral defect at L4-5 is more
prominent.  There is anatomic alignment.  Flexion/extension show no
abnormal movement.

Fluoroscopy Time: 1.30  minutes
IMPRESSION: As above.

CT MYELOGRAPHY LUMBAR SPINE
FINDINGS: No prevertebral or paraspinous masses.  Nonaneurysmal
atherosclerotic calcification of the aorta.

L1-2: Normal.

L2-3: Mild bulge.

L3-4: Mild bulge.  Mild facet arthropathy.

L4-5: Central and rightward protrusion extends into the foramen.
Moderate facet arthropathy. Significant osteophytic spurring
extends rightward into the foramen.  Prominent epidural fat behind
L4.  Right L5 nerve root encroachment in the canal.  Right L4 nerve
root encroachment in the foramen. No significant left sided nerve
root encroachment.

L5-S1: Significant osteophytic spurring extends into both foramina.
There is a central and rightward protrusion.  Mild disc space
narrowing and vacuum disc phenomenon.  Mild facet arthropathy.
Bilateral ligamentum flavum calcification.  Right   S1 nerve root
effacement in the canal.  Bilateral neural foraminal narrowing
appears slightly worse on the right, but either L5 nerve root could
be affected.

Compared to the prior study, the appearance is fairly similar.
Certainly the neural foramen at L5-S1 left is narrowed, but not to
the degree present on the right.
IMPRESSION: Central and rightward protrusion at L4-5 along with asymmetric disc
space narrowing and facet arthropathy results in an right L4 and
right L5 nerve root encroachment.

Mild disc space narrowing L5-S1 with central and rightward
protrusion and  posterior element hypertrophy.  Right S1 nerve root
encroachment is present in the canal.  Right greater than left L5
neural encroachment is present in the foramen.

 Schmorl's node deformities depress the L1 and L2 superior plate.

## 2011-06-24 ENCOUNTER — Other Ambulatory Visit: Payer: Self-pay | Admitting: *Deleted

## 2011-06-24 MED ORDER — PIOGLITAZONE HCL-METFORMIN HCL 15-850 MG PO TABS: 1.0000 | ORAL_TABLET | Freq: Two times a day (BID) | ORAL | Status: DC

## 2011-10-01 ENCOUNTER — Other Ambulatory Visit: Payer: 59

## 2011-10-06 ENCOUNTER — Encounter: Payer: Self-pay | Admitting: Family Medicine

## 2011-10-06 ENCOUNTER — Ambulatory Visit (INDEPENDENT_AMBULATORY_CARE_PROVIDER_SITE_OTHER): Payer: 59 | Admitting: Family Medicine

## 2011-10-06 VITALS — BP 118/78 | HR 96 | Temp 98.1°F | Ht 68.0 in | Wt 211.0 lb

## 2011-10-06 DIAGNOSIS — IMO0002 Reserved for concepts with insufficient information to code with codable children: Secondary | ICD-10-CM

## 2011-10-06 DIAGNOSIS — Z0279 Encounter for issue of other medical certificate: Secondary | ICD-10-CM

## 2011-10-06 DIAGNOSIS — E785 Hyperlipidemia, unspecified: Secondary | ICD-10-CM

## 2011-10-06 DIAGNOSIS — E119 Type 2 diabetes mellitus without complications: Secondary | ICD-10-CM

## 2011-10-06 DIAGNOSIS — N400 Enlarged prostate without lower urinary tract symptoms: Secondary | ICD-10-CM

## 2011-10-06 DIAGNOSIS — Z0000 Encounter for general adult medical examination without abnormal findings: Secondary | ICD-10-CM

## 2011-10-06 DIAGNOSIS — Z1211 Encounter for screening for malignant neoplasm of colon: Secondary | ICD-10-CM

## 2011-10-06 LAB — MICROALBUMIN / CREATININE URINE RATIO: Microalb, Ur: 1 mg/dL (ref 0.0–1.9)

## 2011-10-06 LAB — POCT URINALYSIS DIPSTICK
Ketones, UA: NEGATIVE
Leukocytes, UA: NEGATIVE
Protein, UA: NEGATIVE
Spec Grav, UA: 1.02
pH, UA: 6

## 2011-10-06 LAB — HEMOGLOBIN A1C: Hgb A1c MFr Bld: 6.7 % — ABNORMAL HIGH (ref 4.6–6.5)

## 2011-10-06 NOTE — Assessment & Plan Note (Signed)
Declines spine referral currently. Discussed red flags to seek urgent care.

## 2011-10-06 NOTE — Assessment & Plan Note (Signed)
Chronic, stable. Check a1c today.  Discussed addition of medication if elevated.

## 2011-10-06 NOTE — Assessment & Plan Note (Signed)
Chronic, stable on simvastatin.  Last checked 02/2011 so did not check today, not fasting today.

## 2011-10-06 NOTE — Assessment & Plan Note (Signed)
Preventative protocols reviewed and updated unless pt declined. Filled out DOT forms, cleared 1 year 2/2 h/o DM, although well controlled in past. Discussed healthy diet/lifestyle.

## 2011-10-06 NOTE — Patient Instructions (Signed)
Good to see you today, call us with questions. Return in 6 months for follow up diabetes - watch weight and diet. We will check A1c and stool kit today. Watch right leg for weakness, if worsening please see me or schedule appointment with back doctor.

## 2011-10-06 NOTE — Addendum Note (Signed)
Addended by: Josph Macho A on: 10/06/2011 10:17 AM   Modules accepted: Orders
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
## 2011-10-06 NOTE — Assessment & Plan Note (Signed)
Stable

## 2011-10-28 ENCOUNTER — Other Ambulatory Visit: Payer: Self-pay | Admitting: *Deleted

## 2011-10-28 MED ORDER — PIOGLITAZONE HCL-METFORMIN HCL 15-850 MG PO TABS: 1.0000 | ORAL_TABLET | Freq: Two times a day (BID) | ORAL | Status: DC

## 2011-11-14 ENCOUNTER — Other Ambulatory Visit: Payer: Self-pay | Admitting: *Deleted

## 2011-11-14 MED ORDER — SIMVASTATIN 20 MG PO TABS: 20.0000 mg | ORAL_TABLET | Freq: Every day | ORAL | Status: DC

## 2011-11-18 ENCOUNTER — Other Ambulatory Visit: Payer: 59

## 2011-11-18 ENCOUNTER — Other Ambulatory Visit: Payer: Self-pay | Admitting: Family Medicine

## 2011-11-18 DIAGNOSIS — Z1211 Encounter for screening for malignant neoplasm of colon: Secondary | ICD-10-CM

## 2011-11-18 DIAGNOSIS — R195 Other fecal abnormalities: Secondary | ICD-10-CM

## 2011-11-21 ENCOUNTER — Encounter: Payer: Self-pay | Admitting: Internal Medicine

## 2011-12-17 ENCOUNTER — Telehealth: Payer: Self-pay

## 2011-12-17 NOTE — Telephone Encounter (Signed)
PVC retrievals wanted to see if records release had been received;notified was received today.

## 2012-01-02 ENCOUNTER — Encounter: Payer: Self-pay | Admitting: Internal Medicine

## 2012-01-02 ENCOUNTER — Ambulatory Visit (AMBULATORY_SURGERY_CENTER): Payer: 59 | Admitting: *Deleted

## 2012-01-02 VITALS — Ht 68.0 in | Wt 210.0 lb

## 2012-01-02 DIAGNOSIS — Z1211 Encounter for screening for malignant neoplasm of colon: Secondary | ICD-10-CM

## 2012-01-02 MED ORDER — SUPREP BOWEL PREP KIT 17.5-3.13-1.6 GM/177ML PO SOLN: ORAL | Status: DC

## 2012-01-11 HISTORY — PX: COLONOSCOPY: SHX174

## 2012-01-23 ENCOUNTER — Ambulatory Visit (AMBULATORY_SURGERY_CENTER): Payer: 59 | Admitting: Internal Medicine

## 2012-01-23 ENCOUNTER — Encounter: Payer: Self-pay | Admitting: Internal Medicine

## 2012-01-23 VITALS — BP 139/79 | HR 71 | Temp 98.0°F | Resp 18 | Ht 68.0 in | Wt 210.0 lb

## 2012-01-23 DIAGNOSIS — D126 Benign neoplasm of colon, unspecified: Secondary | ICD-10-CM

## 2012-01-23 DIAGNOSIS — Z1211 Encounter for screening for malignant neoplasm of colon: Secondary | ICD-10-CM

## 2012-01-23 MED ORDER — SODIUM CHLORIDE 0.9 % IV SOLN: 500.0000 mL | INTRAVENOUS | Status: DC

## 2012-01-23 NOTE — Progress Notes (Signed)
Patient did not experience any of the following events: a burn prior to discharge; a fall within the facility; wrong site/side/patient/procedure/implant event; or a hospital transfer or hospital admission upon discharge from the facility. (G8907) Patient did not have preoperative order for IV antibiotic SSI prophylaxis. (G8918)  

## 2012-01-23 NOTE — Progress Notes (Signed)
Called to room to assist during endoscopic procedure.  Patient ID and intended procedure confirmed with present staff. Received instructions for my participation in the procedure from the performing physician.  

## 2012-01-23 NOTE — Progress Notes (Signed)
Pt. Stated that small amount of skin cleanser splashed right eye.  Eye clear without redness.  Eye washed out.  Pt. Stated that He has no pain or discomfort in right eyed after several inquiries.  Right eye clear and no pain at discharge.

## 2012-01-23 NOTE — Op Note (Signed)
[facility] [address] [place], [address]   COLONOSCOPY PROCEDURE REPORT  PATIENT: Logan Tanner, Logan Tanner.  MR#: [id number] BIRTHDATE: 05-Oct-1955 , 56  yrs. old GENDER: Male ENDOSCOPIST: Iva Boop, MD, [facility] REFERRED VH:QIONGE Sharen Hones, M.D. PROCEDURE DATE:  01/23/2012 PROCEDURE:   Colonoscopy with snare polypectomy ASA CLASS:   Class II INDICATIONS:average risk screening. MEDICATIONS: propofol (Diprivan) 200mg  IV, MAC sedation, administered by CRNA, and These medications were titrated to patient response per physician's verbal order  DESCRIPTION OF PROCEDURE:   After the risks benefits and alternatives of the procedure were thoroughly explained, informed consent was obtained.  A digital rectal exam revealed no abnormalities of the rectum and A digital rectal exam revealed the prostate was not enlarged.   The LB [id number]  endoscope was introduced through the anus and advanced to the cecum, which was identified by both the appendix and ileocecal valve. No adverse events experienced.   The quality of the prep was Suprep good  The instrument was then slowly withdrawn as the colon was fully examined.      COLON FINDINGS: Two diminutive smooth sessile polyps were found in the sigmoid colon.  A polypectomy was performed with a cold snare. The resection was complete and the polyp tissue was completely retrieved.   There was moderate diverticulosis noted in the sigmoid colon with associated muscular hypertrophy.   The colon mucosa was otherwise normal.  Retroflexed views revealed no abnormalities. The time to cecum=3 minutes 54 seconds.  Withdrawal time=13 minutes 03 seconds.  The scope was withdrawn and the procedure completed. COMPLICATIONS: There were no complications.  ENDOSCOPIC IMPRESSION: 1.   Two diminutive sessile polyps were found in the sigmoid colon; polypectomy was performed with a cold snare 2.   There was moderate diverticulosis noted  in the sigmoid colon 3.   The colon mucosa was otherwise normal - good prep  RECOMMENDATIONS: Timing of repeat colonoscopy will be determined by pathology findings.   eSigned:  Iva Boop, MD, [facility] 01/23/2012 9:03 AM  cc: Eustaquio Boyden MD and The Patient

## 2012-01-23 NOTE — Patient Instructions (Addendum)
Two very small polyps were removed. You also have diverticulosis - pockets in the colon.  Please read the handouts and I will send a letter about polyp pathology and when to have another routine colonoscopy.  Thank you for choosing me and [facility].  Iva Boop, MD, FACG  YOU HAD AN ENDOSCOPIC PROCEDURE TODAY AT THE [facility]: Refer to the procedure report that was given to you for any specific questions about what was found during the examination.  If the procedure report does not answer your questions, please call your gastroenterologist to clarify.  If you requested that your care partner not be given the details of your procedure findings, then the procedure report has been included in a sealed envelope for you to review at your convenience later.  YOU SHOULD EXPECT: Some feelings of bloating in the abdomen. Passage of more gas than usual.  Walking can help get rid of the air that was put into your GI tract during the procedure and reduce the bloating. If you had a lower endoscopy (such as a colonoscopy or flexible sigmoidoscopy) you may notice spotting of blood in your stool or on the toilet paper. If you underwent a bowel prep for your procedure, then you may not have a normal bowel movement for a few days.  DIET: Your first meal following the procedure should be a light meal and then it is ok to progress to your normal diet.  A half-sandwich or bowl of soup is an example of a good first meal.  Heavy or fried foods are harder to digest and may make you feel nauseous or bloated.  Likewise meals heavy in dairy and vegetables can cause extra gas to form and this can also increase the bloating.  Drink plenty of fluids but you should avoid alcoholic beverages for 24 hours.  ACTIVITY: Your care partner should take you home directly after the procedure.  You should plan to take it easy, moving slowly for the rest of the day.  You can resume normal activity the day after  the procedure however you should NOT DRIVE or use heavy machinery for 24 hours (because of the sedation medicines used during the test).    SYMPTOMS TO REPORT IMMEDIATELY: A gastroenterologist can be reached at any hour.  During normal business hours, 8:30 AM to 5:00 PM Monday through Friday, call [phone].  After hours and on weekends, please call the GI answering service at [phone] who will take a message and have the physician on call contact you.   Following lower endoscopy (colonoscopy or flexible sigmoidoscopy):  Excessive amounts of blood in the stool  Significant tenderness or worsening of abdominal pains  Swelling of the abdomen that is new, acute  Fever of 100F or higher  FOLLOW UP: If any biopsies were taken you will be contacted by phone or by letter within the next 1-3 weeks.  Call your gastroenterologist if you have not heard about the biopsies in 3 weeks.  Our staff will call the home number listed on your records the next business day following your procedure to check on you and address any questions or concerns that you may have at that time regarding the information given to you following your procedure. This is a courtesy call and so if there is no answer at the home number and we have not heard from you through the emergency physician on call, we will assume that you have returned to your regular daily activities  without incident.  SIGNATURES/CONFIDENTIALITY: You and/or your care partner have signed paperwork which will be entered into your electronic medical record.  These signatures attest to the fact that that the information above on your After Visit Summary has been reviewed and is understood.  Full responsibility of the confidentiality of this discharge information lies with you and/or your care-partner.    Polyp,diverticulosis, high fiber diet information given.  Dr. Leone Payor will advise in your follow-up letter when you need next colonoscopy.

## 2012-01-26 ENCOUNTER — Telehealth: Payer: Self-pay | Admitting: *Deleted

## 2012-01-26 NOTE — Telephone Encounter (Signed)
  Follow up Call-  Call back number 01/23/2012  Post procedure Call Back phone  # [phone]  Permission to leave phone message Yes     Patient questions:  Do you have a fever, pain , or abdominal swelling? no Pain Score  0 *  Have you tolerated food without any problems? yes  Have you been able to return to your normal activities? yes  Do you have any questions about your discharge instructions: Diet   no Medications  no Follow up visit  no  Do you have questions or concerns about your Care? no  Actions: * If pain score is 4 or above: No action needed, pain <4.

## 2012-01-27 ENCOUNTER — Encounter: Payer: Self-pay | Admitting: Internal Medicine

## 2012-01-27 NOTE — Progress Notes (Signed)
Quick Note:  Diminutive sigmoid hyperplastic polyps (2) Repeat colon 10 years 2023 ______

## 2012-01-28 ENCOUNTER — Encounter: Payer: Self-pay | Admitting: Family Medicine

## 2012-02-20 ENCOUNTER — Telehealth: Payer: Self-pay

## 2012-02-20 NOTE — Telephone Encounter (Signed)
Pt having to change insurance 03/14/12; Actos plus met will cost $245 and simvastatin will cost $75.00. Pt needs more cost effective meds. Pt said could Actos and metformin be written separately and cost less? Pt is interested in [facility]'s $4.00 list. Pt said would like to know what Dr Sharen Hones plan is before sending to a pharmacy; pt may change from medicap depending on cost. Pt said if needs to be seen to discuss he is willing.Please advise.

## 2012-02-23 NOTE — Telephone Encounter (Signed)
We can change to individual components of actos and metformin - should be cheaper. i'm not sure about simvastatin - can he call insurance to see what statin is on formulary and we can then change to that one.

## 2012-02-24 NOTE — Telephone Encounter (Signed)
Spoke with patient. He said he doesn't needs meds right now. He said his employer isn't paying for insurance coverage anymore and for him to get coverage himself without med coverage, he would have to pay $5500 for meds before insurance would cover them. If he got insurance with med coverage it is going to cost him over $500/month. He will need meds split up and will need statin switched to lovastatin or pravastatin. He will call me back to let me know which pharmacy.

## 2012-03-29 ENCOUNTER — Telehealth: Payer: Self-pay | Admitting: *Deleted

## 2012-03-29 NOTE — Telephone Encounter (Signed)
Patient called and is about to run out of his Actosplus met. We were going to split this up into separate components for him at his next refill due to cost/insurance. Please send to [facility] pharmacy.

## 2012-03-30 ENCOUNTER — Ambulatory Visit (INDEPENDENT_AMBULATORY_CARE_PROVIDER_SITE_OTHER): Payer: 59 | Admitting: Family Medicine

## 2012-03-30 ENCOUNTER — Encounter: Payer: Self-pay | Admitting: Family Medicine

## 2012-03-30 ENCOUNTER — Encounter: Payer: Self-pay | Admitting: *Deleted

## 2012-03-30 VITALS — BP 120/84 | HR 76 | Temp 98.1°F | Ht 68.0 in

## 2012-03-30 DIAGNOSIS — H8110 Benign paroxysmal vertigo, unspecified ear: Secondary | ICD-10-CM | POA: Insufficient documentation

## 2012-03-30 MED ORDER — PROMETHAZINE HCL 25 MG PO TABS: 25.0000 mg | ORAL_TABLET | Freq: Four times a day (QID) | ORAL | Status: DC | PRN

## 2012-03-30 MED ORDER — METFORMIN HCL 850 MG PO TABS: 850.0000 mg | ORAL_TABLET | Freq: Two times a day (BID) | ORAL | Status: DC

## 2012-03-30 MED ORDER — MECLIZINE HCL 32 MG PO TABS: 32.0000 mg | ORAL_TABLET | Freq: Three times a day (TID) | ORAL | Status: DC | PRN

## 2012-03-30 MED ORDER — PROMETHAZINE HCL 25 MG/ML IJ SOLN
25.0000 mg | Freq: Once | INTRAMUSCULAR | Status: AC
  Administered 2012-03-30: 25 mg via INTRAMUSCULAR

## 2012-03-30 MED ORDER — PIOGLITAZONE HCL 30 MG PO TABS: 30.0000 mg | ORAL_TABLET | Freq: Every day | ORAL | Status: DC

## 2012-03-30 NOTE — Assessment & Plan Note (Signed)
No clear suggestion of intracranial lesion or CVA.  Likely BPPV... Treat with meclizine and phenergan for symptoms. Start home eply maneuver's. Call if nt improivng in 2 weeks for referral to vestibular rehab

## 2012-03-30 NOTE — Progress Notes (Signed)
  Subjective:    Patient ID: Logan Tanner, male    DOB: 05-22-1955, 57 y.o.   MRN: [id number]  HPI  57 year old male presents with new onset vertigo, feels like he is leaning to one side. Sudden onset today sitting at desk at 11 AM today. Nausea severe, resulting in vomiting.  Worse with moving head or sitting/ standing. Better if lying down still. No fever.  Feels hot and sweaty.  No headache, no ear pain.  no CP or shortness of breath.  No new neuro changes. No new numbness or weakness.  Took dramamine but did not help much.  In past he had inner ear issues 10 years ago. Dx by Dr. Hetty Ely.  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  HENT: Positive for tinnitus. Negative for ear pain.   Eyes: Negative for pain.  Respiratory: Negative for cough and shortness of breath.   Cardiovascular: Negative for chest pain.  Genitourinary: Negative for dysuria.       Objective:   Physical Exam  Constitutional: Vital signs are normal. He appears well-developed and well-nourished.  HENT:  Head: Normocephalic.  Right Ear: Hearing normal.  Left Ear: Hearing normal.  Nose: Nose normal.  Mouth/Throat: Oropharynx is clear and moist and mucous membranes are normal.  Eyes: Conjunctivae are normal. Pupils are equal, round, and reactive to light. Right eye exhibits nystagmus.  Fundoscopic exam:      The right eye shows no papilledema.       The left eye shows no papilledema.  Neck: Trachea normal. Carotid bruit is not present. No mass and no thyromegaly present.  Cardiovascular: Normal rate, regular rhythm and normal pulses.  Exam reveals no gallop, no distant heart sounds and no friction rub.   No murmur heard. No peripheral edema  Pulmonary/Chest: Effort normal and breath sounds normal. No respiratory distress.  Neurological: He has normal strength. No cranial nerve deficit or sensory deficit. Coordination and  gait abnormal.  Skin: Skin is warm, dry and intact. No rash noted.  Psychiatric: He has a normal mood and affect. His speech is normal and behavior is normal. Thought content normal.          Assessment & Plan:

## 2012-03-30 NOTE — Addendum Note (Signed)
Addended by: Consuello Masse on: 03/30/2012 04:21 PM   Modules accepted: Orders

## 2012-03-30 NOTE — Telephone Encounter (Signed)
plz notify sent in.  1 mo supply at a time.

## 2012-03-30 NOTE — Telephone Encounter (Signed)
Patient notified

## 2012-03-30 NOTE — Patient Instructions (Addendum)
Use phenergan for nausea. Can use meclizine for vertigo. Start home Eply maneuvers.  I not improving in next 2 weeks call for referral to vestibular rehab.

## 2012-04-07 ENCOUNTER — Ambulatory Visit: Payer: 59 | Admitting: Family Medicine

## 2012-04-07 DIAGNOSIS — Z0289 Encounter for other administrative examinations: Secondary | ICD-10-CM

## 2012-05-27 ENCOUNTER — Other Ambulatory Visit: Payer: Self-pay | Admitting: Family Medicine

## 2012-05-27 MED ORDER — SIMVASTATIN 20 MG PO TABS: 20.0000 mg | ORAL_TABLET | Freq: Every day | ORAL | Status: DC

## 2012-05-27 NOTE — Telephone Encounter (Signed)
Pt no-showed f/u appt on 04/07/12 and no future appts, ok to refill? Please advise

## 2012-06-22 ENCOUNTER — Telehealth: Payer: Self-pay

## 2012-06-22 NOTE — Telephone Encounter (Signed)
Pt received letter from [employer] that he needs PA for Piolitazone-metformin 15 mg/850 mg. Pt had been taking metformin and actos separately but that causes indigestion so pt had refills available for combination med and got filled with no problem, now received letter from [employer]. Advised pt of PA process and he will contact [facility] for refill and if PA needed [facility] with request PA.

## 2012-06-23 NOTE — Telephone Encounter (Addendum)
Prior auth required Pioglitazone/metformin 15-850 mg. Spoke with [place] [phone] approved over phone 06/23/12 - 06/24/15. Ref # [id number]; approval letter to follow. Kim at [facility] said rx went thru.

## 2012-07-13 ENCOUNTER — Telehealth: Payer: Self-pay

## 2012-07-13 NOTE — Telephone Encounter (Signed)
Pt left v/m requesting cb about CPX for this year. Left v/m for pt to cb.

## 2012-07-14 NOTE — Telephone Encounter (Signed)
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 Pt said he cannot afford to pay for 2 physicals and will have to change PCP if cannot get DOT form filled out with annual CPX.pt request call back.

## 2012-07-14 NOTE — Telephone Encounter (Signed)
I'm sorry I'm no longer certified to do DOT physicals.  We can see him for all other issues, but unfortunately he will need to go to [facility] that is certified for DOT physical.

## 2012-07-15 NOTE — Telephone Encounter (Signed)
Pt notified we longer do DOT physicals and he may want to look into getting it done at an [facility] that's certified

## 2012-08-02 ENCOUNTER — Telehealth: Payer: Self-pay | Admitting: *Deleted

## 2012-08-02 MED ORDER — PIOGLITAZONE HCL-METFORMIN HCL 15-850 MG PO TABS: 1.0000 | ORAL_TABLET | Freq: Two times a day (BID) | ORAL | Status: DC

## 2012-08-02 NOTE — Telephone Encounter (Signed)
Sent in

## 2012-08-02 NOTE — Telephone Encounter (Signed)
Received refill request from pharmacy for Actoplus/ pioglitazone/Metformin 15-850 mg #60, take one twice daily with a meal. This does not match medication sheet. See phone note 06/22/12. Is it okay to refill medication and change medication list?

## 2012-08-19 ENCOUNTER — Other Ambulatory Visit: Payer: Self-pay

## 2013-04-25 ENCOUNTER — Other Ambulatory Visit: Payer: Self-pay | Admitting: *Deleted

## 2013-04-25 MED ORDER — GLUCOSE BLOOD VI STRP: 1.0000 | ORAL_STRIP | Freq: Two times a day (BID) | Status: DC | PRN

## 2013-04-26 ENCOUNTER — Telehealth: Payer: Self-pay | Admitting: *Deleted

## 2013-04-26 NOTE — Telephone Encounter (Signed)
Form for PA for OneTouch Ultra Blue Strips placed in your In Box.

## 2013-04-26 NOTE — Telephone Encounter (Signed)
Filled and placed in my out box. 

## 2013-06-08 ENCOUNTER — Other Ambulatory Visit: Payer: Self-pay | Admitting: *Deleted

## 2013-06-08 MED ORDER — SIMVASTATIN 20 MG PO TABS: ORAL_TABLET | ORAL | Status: DC

## 2013-07-14 ENCOUNTER — Encounter: Payer: Self-pay | Admitting: Family Medicine

## 2013-07-14 ENCOUNTER — Ambulatory Visit (INDEPENDENT_AMBULATORY_CARE_PROVIDER_SITE_OTHER): Payer: No Typology Code available for payment source | Admitting: Family Medicine

## 2013-07-14 VITALS — BP 136/68 | HR 92 | Temp 98.1°F | Wt 215.5 lb

## 2013-07-14 DIAGNOSIS — R1310 Dysphagia, unspecified: Secondary | ICD-10-CM

## 2013-07-14 DIAGNOSIS — F17200 Nicotine dependence, unspecified, uncomplicated: Secondary | ICD-10-CM

## 2013-07-14 DIAGNOSIS — E785 Hyperlipidemia, unspecified: Secondary | ICD-10-CM

## 2013-07-14 DIAGNOSIS — E119 Type 2 diabetes mellitus without complications: Secondary | ICD-10-CM

## 2013-07-14 LAB — POCT URINALYSIS DIPSTICK
Bilirubin, UA: NEGATIVE
GLUCOSE UA: NEGATIVE
Ketones, UA: NEGATIVE
Leukocytes, UA: NEGATIVE
NITRITE UA: NEGATIVE
Protein, UA: NEGATIVE
RBC UA: NEGATIVE
Spec Grav, UA: 1.015
Urobilinogen, UA: 0.2
pH, UA: 6

## 2013-07-14 MED ORDER — SITAGLIPTIN PHOS-METFORMIN HCL 50-500 MG PO TABS: 1.0000 | ORAL_TABLET | Freq: Two times a day (BID) | ORAL | Status: DC

## 2013-07-14 MED ORDER — SIMVASTATIN 20 MG PO TABS: ORAL_TABLET | ORAL | Status: DC

## 2013-07-14 MED ORDER — PIOGLITAZONE HCL-METFORMIN HCL 15-850 MG PO TABS: 1.0000 | ORAL_TABLET | Freq: Two times a day (BID) | ORAL | Status: DC

## 2013-07-14 MED ORDER — OMEPRAZOLE 40 MG PO CPDR: 40.0000 mg | DELAYED_RELEASE_CAPSULE | Freq: Every day | ORAL | Status: DC

## 2013-07-14 NOTE — Assessment & Plan Note (Signed)
Restarted smoking - motivated to quit with wife. Encouraged quitting.

## 2013-07-14 NOTE — Assessment & Plan Note (Addendum)
Longstanding issue of choking to solids and liquids.  No vomiting, no weight loss or early satiety. Will do 3 wk trial of PPI omeprazole 40mg  daily, if not improved with this, discussed importance to notify me for referral to GI.

## 2013-07-14 NOTE — Patient Instructions (Addendum)
Return at your convenience for fasting blood work. Meds refilled today. Stop actos plusmet.  Start janumet - 1 pill daily for 1 week then increase to 1 pill twice daily. Return in 4-5 months for recheck diabetes on new medication. Urine checked today. For acid reflux with some choking - start omeprazole 40mg  daily for 3 weeks then as needed.  If this doesn't resolve choking episodes, let me know for referral to GI for further evaluation.

## 2013-07-14 NOTE — Progress Notes (Signed)
BP 136/68  Pulse 92  Temp(Src) 98.1 F (36.7 C) (Oral)  Wt 215 lb 8 oz (97.75 kg)   CC: f/u DM visit  Subjective:    Patient ID: Logan Tanner, male    DOB: 07/06/1955, 58 y.o.   MRN: [id number]  HPI: Logan Tanner is a 58 y.o. male presenting on 07/14/2013 for Follow-up   Not seen by me since 09/2011.  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  DM - regularly does check sugars: 120-150 postprandial, 110-120 fasting.  Compliant with antihyperglycemic regimen which includes: actoplus met 15/852m bid.  On this med for 10+ years. Denies hematuria. Denies low sugars or hypoglycemic symptoms.  + paresthesias mainly R foot (chronic lateral foot). Last diabetic eye exam DUE.  Pneumovax: 02/2011.  Prevnar: not done. Lab Results  Component Value Date   HGBA1C 6.7* 10/06/2011    Quit smoking - weight gain noted. Restarted smoking. Currently at 1 ppd. Motivated to quit.  HLD - compliant and tolerating simvastatin 264mnightly  Dysphagia - endorses choking sensation that occurs several times a week.  Solid and liquid dysphagia.  + GERD.  Currently on no meds for this.  Longstanding h/o this, unchanged. Wakes up at night with choking occasionally. No weight loss, no vomiting, no early satiety.  Wt Readings from Last 3 Encounters:  07/14/13 215 lb 8 oz (97.75 kg)  01/23/12 210 lb (95.255 kg)  01/02/12 210 lb (95.255 kg)   Body mass index is 32.77 kg/(m^2).  Relevant past medical, surgical, family and social history reviewed and updated as indicated.  Allergies and medications reviewed and updated. Current Outpatient Prescriptions on File Prior to Visit  Medication Sig  . aspirin 81 MG tablet Take 81 mg by mouth daily.    . Marland Kitchenlucose blood test strip 1 each by Other route 2 (two) times daily as needed for other. Please schedule an appointment with Dr, having fasting labs prior for any refills  . meclizine (ANTIVERT) 32 MG tablet Take 1 tablet (32 mg total) by mouth 3  (three) times daily as needed.  . naproxen sodium (ANAPROX) 220 MG tablet Take 220 mg by mouth as needed.    No current facility-administered medications on file prior to visit.    Review of Systems Per HPI unless specifically indicated above    Objective:    BP 136/68  Pulse 92  Temp(Src) 98.1 F (36.7 C) (Oral)  Wt 215 lb 8 oz (97.75 kg)  Physical Exam  Nursing note and vitals reviewed. Constitutional: He appears well-developed and well-nourished. No distress.  HENT:  Head: Normocephalic and atraumatic.  Right Ear: External ear normal.  Left Ear: External ear normal.  Nose: Nose normal.  Mouth/Throat: Oropharynx is clear and moist. No oropharyngeal exudate.  Eyes: Conjunctivae and EOM are normal. Pupils are equal, round, and reactive to light. No scleral icterus.  Neck: Normal range of motion. Neck supple. No thyromegaly present.  Cardiovascular: Normal rate, regular rhythm, normal heart sounds and intact distal pulses.   No murmur heard. Pulmonary/Chest: Effort normal and breath sounds normal. No respiratory distress. He has no wheezes. He has no rales.  Abdominal: Soft. Bowel sounds are normal. He exhibits no distension and no mass. There is no tenderness. There is no rebound and no guarding.  Musculoskeletal: He exhibits no edema.  Diabetic foot exam: Normal inspection No skin breakdown No calluses  Normal DP/PT pulses Normal sensation to light touch and slightly diminished to monofilament on right lateral foot  Nails normal  Lymphadenopathy:    He has no cervical adenopathy.  Skin: Skin is warm and dry. No rash noted.  Psychiatric: He has a normal mood and affect.       Assessment & Plan:   Problem List Items Addressed This Visit   Smoker     Restarted smoking - motivated to quit with wife. Encouraged quitting.    HYPERLIPIDEMIA     Chronic, stable. Continue statin.    Relevant Medications      simvastatin (ZOCOR) tablet   Dysphagia, unspecified(787.20)       Longstanding issue of choking to solids and liquids.  No vomiting, no weight loss or early satiety. Will do 3 wk trial of PPI omeprazole 46m daily, if not improved with this, discussed importance to notify me for referral to GI.    DIABETES MELLITUS, TYPE II - Primary     Chronic. Discussed actos plus met and possible association with bladder - check UA today. Pt desires to transition off actos - will start janumet 50/500 bid. rtc 4 mo for f/u.    Relevant Medications      simvastatin (ZOCOR) tablet      sitaGLIPtin-metformin (JANUMET) 50-500 MG per tablet   Other Relevant Orders      HM Diabetes Foot Exam (Completed)       Follow up plan: Return in about 1 year (around 07/15/2014), or as needed, for annual exam, prior fasting for blood work.

## 2013-07-14 NOTE — Addendum Note (Signed)
Addended by: Royann Shivers A on: 07/14/2013 05:29 PM   Modules accepted: Orders

## 2013-07-14 NOTE — Assessment & Plan Note (Addendum)
Chronic. Discussed actos plus met and possible association with bladder - check UA today. On actos 66m total daily for last 5+ yars Pt desires to transition off actos - will start janumet 50/500 bid. rtc 4 mo for f/u.

## 2013-07-14 NOTE — Assessment & Plan Note (Signed)
Chronic, stable. Continue statin.  

## 2013-07-14 NOTE — Progress Notes (Signed)
Pre visit review using our clinic review tool, if applicable. No additional management support is needed unless otherwise documented below in the visit note. 

## 2013-07-15 ENCOUNTER — Encounter: Payer: Self-pay | Admitting: *Deleted

## 2013-07-15 ENCOUNTER — Telehealth: Payer: Self-pay | Admitting: Family Medicine

## 2013-07-15 NOTE — Telephone Encounter (Signed)
Relevant patient education mailed to patient.  

## 2013-07-18 ENCOUNTER — Telehealth: Payer: Self-pay | Admitting: *Deleted

## 2013-07-18 NOTE — Telephone Encounter (Signed)
PA for Janumet in your IN box for completion.

## 2013-07-18 NOTE — Telephone Encounter (Signed)
Filled, placed in my outbox

## 2013-07-20 ENCOUNTER — Telehealth: Payer: Self-pay | Admitting: *Deleted

## 2013-07-20 NOTE — Telephone Encounter (Signed)
PA faxed. Will await determination. 

## 2013-07-20 NOTE — Telephone Encounter (Signed)
filled and placed in [employer] box.

## 2013-07-20 NOTE — Telephone Encounter (Signed)
PA for Janumet in your IN box for completion. (Apparently [employer] doesn't handle pt's PA's-even though ins card says [employer]?) Attempting through [place] now.

## 2013-07-22 ENCOUNTER — Other Ambulatory Visit (INDEPENDENT_AMBULATORY_CARE_PROVIDER_SITE_OTHER): Payer: No Typology Code available for payment source

## 2013-07-22 DIAGNOSIS — E785 Hyperlipidemia, unspecified: Secondary | ICD-10-CM

## 2013-07-22 DIAGNOSIS — R635 Abnormal weight gain: Secondary | ICD-10-CM

## 2013-07-22 DIAGNOSIS — E119 Type 2 diabetes mellitus without complications: Secondary | ICD-10-CM

## 2013-07-22 DIAGNOSIS — Z0000 Encounter for general adult medical examination without abnormal findings: Secondary | ICD-10-CM

## 2013-07-22 LAB — LIPID PANEL
CHOL/HDL RATIO: 4
CHOLESTEROL: 137 mg/dL (ref 0–200)
HDL: 36.2 mg/dL — AB (ref >39.00)
LDL Cholesterol: 81 mg/dL (ref 0–99)
NonHDL: 100.8
TRIGLYCERIDES: 98 mg/dL (ref 0.0–149.0)
VLDL: 19.6 mg/dL (ref 0.0–40.0)

## 2013-07-22 LAB — COMPREHENSIVE METABOLIC PANEL
ALBUMIN: 3.9 g/dL (ref 3.5–5.2)
ALT: 54 U/L — ABNORMAL HIGH (ref 0–53)
AST: 35 U/L (ref 0–37)
Alkaline Phosphatase: 71 U/L (ref 39–117)
BUN: 18 mg/dL (ref 6–23)
CALCIUM: 9.1 mg/dL (ref 8.4–10.5)
CO2: 28 mEq/L (ref 19–32)
Chloride: 105 mEq/L (ref 96–112)
Creatinine, Ser: 0.9 mg/dL (ref 0.4–1.5)
GFR: 95.36 mL/min (ref >60.00)
Glucose, Bld: 179 mg/dL — ABNORMAL HIGH (ref 70–99)
POTASSIUM: 4.9 meq/L (ref 3.5–5.1)
SODIUM: 139 meq/L (ref 135–145)
TOTAL PROTEIN: 6.9 g/dL (ref 6.0–8.3)
Total Bilirubin: 0.4 mg/dL (ref 0.2–1.2)

## 2013-07-22 LAB — MICROALBUMIN / CREATININE URINE RATIO
Creatinine,U: 176 mg/dL
Microalb Creat Ratio: 1.6 mg/g (ref 0.0–30.0)
Microalb, Ur: 2.8 mg/dL — ABNORMAL HIGH (ref 0.0–1.9)

## 2013-07-22 LAB — HEMOGLOBIN A1C: Hgb A1c MFr Bld: 7.2 % — ABNORMAL HIGH (ref 4.6–6.5)

## 2013-07-25 ENCOUNTER — Encounter: Payer: Self-pay | Admitting: *Deleted

## 2013-07-26 MED ORDER — LINAGLIPTIN-METFORMIN HCL 2.5-500 MG PO TABS: 1.0000 | ORAL_TABLET | Freq: Two times a day (BID) | ORAL | Status: DC

## 2013-07-26 NOTE — Telephone Encounter (Signed)
Patient notified and will call back if any problems.

## 2013-07-26 NOTE — Telephone Encounter (Signed)
Janumet denied. Denial and covered meds in your IN box. Patient is out of meds.

## 2013-07-26 NOTE — Telephone Encounter (Signed)
janumet not covered.  Sent in [place] instead which is same family.  Have pt price out and call us with questions or concerns.

## 2013-07-26 NOTE — Telephone Encounter (Signed)
Pt calling to ck on status of Janumet PA; pt out of med.Please advise.

## 2013-07-27 ENCOUNTER — Telehealth: Payer: Self-pay | Admitting: Family Medicine

## 2013-07-27 ENCOUNTER — Other Ambulatory Visit: Payer: Self-pay | Admitting: *Deleted

## 2013-07-27 MED ORDER — PIOGLITAZONE HCL-METFORMIN HCL 15-850 MG PO TABS: 1.0000 | ORAL_TABLET | Freq: Two times a day (BID) | ORAL | Status: DC

## 2013-07-27 NOTE — Telephone Encounter (Signed)
Spoke with pharmacist. Patient has ~$5000.00 medication deductible and any of the newer meds (that are covered) are going to cost over $300. The actoplusmet is only $20.00. I sent in a 1 month supply of what he was previously taking until you decide if that is what you want him to stay on. There is no way he can afford any of the newer meds.

## 2013-07-27 NOTE — Telephone Encounter (Signed)
Let's refill actos-plus-metformin while we figure insurance issue out.   I sent jentadueto because it was listed as recommended alternative to janumet. plz have pt check with pharmacist which is cheapest alternative - jentadueto or kombiglyze (or if separate pills will be cheaper (metformin + onglyza or tradjenta))

## 2013-07-27 NOTE — Telephone Encounter (Signed)
Pt says the medication that was called in yesterday (pt says it's in the same family as Januvia) is $345 w/his insurance and he can't afford that. Pt wants to know if something else can be called in that's less expensive and also wants to know if you want him to refill his Metformin-Actose till you can figure out another medication. Please advise patient. Thank you.

## 2013-07-27 NOTE — Telephone Encounter (Signed)
Recommend continue actos plus met for now, plz have him ask for his insurance company's diabetes formulary and bring to review at next visit.

## 2013-08-08 NOTE — Telephone Encounter (Signed)
Patient is aware and formulary printed and placed in Dr. Synthia Innocent IN box for review.

## 2013-08-13 MED ORDER — METFORMIN HCL 1000 MG PO TABS: 1000.0000 mg | ORAL_TABLET | Freq: Two times a day (BID) | ORAL | Status: DC

## 2013-08-13 MED ORDER — GLIMEPIRIDE 1 MG PO TABS: 1.0000 mg | ORAL_TABLET | Freq: Every day | ORAL | Status: DC

## 2013-08-13 NOTE — Addendum Note (Signed)
Addended by: Ria Bush on: 08/13/2013 09:47 PM   Modules accepted: Orders, Medications

## 2013-08-13 NOTE — Telephone Encounter (Signed)
When pt finishes actosplusmet, will have him stop this and start: Metformin 1000mg  bid + Glimepiride (amaryl) 1mg  daily Will need to monitor closely for low sugars when startign glimepiride (amaryl) Sent meds to [facility]

## 2013-08-16 NOTE — Telephone Encounter (Signed)
Patient notified and verbalized understanding. 

## 2013-11-14 ENCOUNTER — Encounter: Payer: Self-pay | Admitting: Family Medicine

## 2013-11-14 ENCOUNTER — Ambulatory Visit (INDEPENDENT_AMBULATORY_CARE_PROVIDER_SITE_OTHER): Payer: No Typology Code available for payment source | Admitting: Family Medicine

## 2013-11-14 VITALS — BP 128/82 | HR 84 | Temp 98.1°F | Wt 209.5 lb

## 2013-11-14 DIAGNOSIS — E785 Hyperlipidemia, unspecified: Secondary | ICD-10-CM

## 2013-11-14 DIAGNOSIS — Z720 Tobacco use: Secondary | ICD-10-CM

## 2013-11-14 DIAGNOSIS — F17200 Nicotine dependence, unspecified, uncomplicated: Secondary | ICD-10-CM

## 2013-11-14 DIAGNOSIS — E1165 Type 2 diabetes mellitus with hyperglycemia: Secondary | ICD-10-CM

## 2013-11-14 DIAGNOSIS — IMO0001 Reserved for inherently not codable concepts without codable children: Secondary | ICD-10-CM

## 2013-11-14 DIAGNOSIS — R1310 Dysphagia, unspecified: Secondary | ICD-10-CM

## 2013-11-14 LAB — HEMOGLOBIN A1C: Hgb A1c MFr Bld: 6.6 % — ABNORMAL HIGH (ref 4.6–6.5)

## 2013-11-14 NOTE — Assessment & Plan Note (Signed)
Chronic, stable. Continue simvastatin.  

## 2013-11-14 NOTE — Assessment & Plan Note (Signed)
Actually improved off actos plusmet

## 2013-11-14 NOTE — Progress Notes (Signed)
Pre visit review using our clinic review tool, if applicable. No additional management support is needed unless otherwise documented below in the visit note. 

## 2013-11-14 NOTE — Patient Instructions (Signed)
Let's check a1c today. Good to see you, call us with questions. Check with insurance to see if a physical is covered, if so, return in December. Otherwise return in 6 months to 1 year for follow up diabetes.

## 2013-11-14 NOTE — Assessment & Plan Note (Signed)
Continue to encourage cessation - precontemplative. Did not tolerate vapes. Discussed NRT and recruiting wife to quit with him.

## 2013-11-14 NOTE — Assessment & Plan Note (Signed)
Chronic, stable with recent changes. Now on amaryl 1mg  1/2 tab bid with lunch/dinner and metformin 1000mg  bid. DPP4-I family too expensive esp now he's planning on going self pay.

## 2013-11-14 NOTE — Progress Notes (Signed)
BP 128/82  Pulse 84  Temp(Src) 98.1 F (36.7 C) (Oral)  Wt 209 lb 8 oz (95.029 kg)   CC: 4 mo f/u  Subjective:    Patient ID: Logan Tanner, male    DOB: 06/20/1955, 58 y.o.   MRN: [id number]  HPI: Logan Tanner is a 58 y.o. male presenting on 11/14/2013 for Follow-up   About to go to self pay next year.   DM - regularly does check sugars: 90-120s. Not following diabetic diet - lots of potatoes and pasta. Previously on actoplus met 15/850m bid - we transitioned off this and onto metformin 10016mbid and amaryl 49m47maily. Some low sugars in am because he doesn't eat breakfast. Now doing better - takes 1/2 tab at lunch and 1/2 at supper. +paresthesias mainly R foot (chronic lateral foot). Last diabetic eye exam DUE. Pneumovax: 02/2011. Prevnar: not done.  Lab Results  Component Value Date   HGBA1C 7.2* 07/22/2013    HLD - compliant and tolerating simvastatin 24m68mghtly   Quit smoking - weight gain noted. Restarted smoking. Currently at 1 ppd. Motivated to quit.   Dysphagia - endorses choking sensation that occurs several times a week. Solid and liquid dysphagia. + GERD. Currently on no meds for this. Longstanding h/o this, unchanged. Wakes up at night with choking occasionally. No weight loss, no vomiting, no early satiety. Last visit we recommended trial of PPI - actually dysphagia improved off actos plus met Wt Readings from Last 3 Encounters:  11/14/13 209 lb 8 oz (95.029 kg)  07/14/13 215 lb 8 oz (97.75 kg)  01/23/12 210 lb (95.255 kg)   Body mass index is 31.86 kg/(m^2).  Relevant past medical, surgical, family and social history reviewed and updated as indicated.  Allergies and medications reviewed and updated. Current Outpatient Prescriptions on File Prior to Visit  Medication Sig  . aspirin 81 MG tablet Take 81 mg by mouth daily.    . glMarland Kitchencose blood test strip 1 each by Other route 2 (two) times daily as needed for other. Please schedule an appointment with Dr,  having fasting labs prior for any refills  . metFORMIN (GLUCOPHAGE) 1000 MG tablet Take 1 tablet (1,000 mg total) by mouth 2 (two) times daily with a meal.  . simvastatin (ZOCOR) 20 MG tablet Take one tablet daily at bedtime.  . meclizine (ANTIVERT) 32 MG tablet Take 1 tablet (32 mg total) by mouth 3 (three) times daily as needed.  . naproxen sodium (ANAPROX) 220 MG tablet Take 220 mg by mouth as needed.   . omMarland Kitchenprazole (PRILOSEC) 40 MG capsule Take 1 capsule (40 mg total) by mouth daily.   No current facility-administered medications on file prior to visit.    Review of Systems Per HPI unless specifically indicated above    Objective:    BP 128/82  Pulse 84  Temp(Src) 98.1 F (36.7 C) (Oral)  Wt 209 lb 8 oz (95.029 kg)  Physical Exam  Nursing note and vitals reviewed. Constitutional: He appears well-developed and well-nourished. No distress.  HENT:  Mouth/Throat: Oropharynx is clear and moist. No oropharyngeal exudate.  Eyes: Conjunctivae and EOM are normal. Pupils are equal, round, and reactive to light. No scleral icterus.  Cardiovascular: Normal rate, regular rhythm, normal heart sounds and intact distal pulses.   No murmur heard. Pulmonary/Chest: Effort normal and breath sounds normal. No respiratory distress. He has no wheezes. He has no rales.  Musculoskeletal: He exhibits no edema.  Lymphadenopathy:    He  has no cervical adenopathy.  Skin: Skin is warm and dry. No rash noted.  Psychiatric: He has a normal mood and affect.   Results for orders placed in visit on 07/22/13  COMPREHENSIVE METABOLIC PANEL      Result Value Ref Range   Sodium 139  135 - 145 mEq/L   Potassium 4.9  3.5 - 5.1 mEq/L   Chloride 105  96 - 112 mEq/L   CO2 28  19 - 32 mEq/L   Glucose, Bld 179 (*) 70 - 99 mg/dL   BUN 18  6 - 23 mg/dL   Creatinine, Ser 0.9  0.4 - 1.5 mg/dL   Total Bilirubin 0.4  0.2 - 1.2 mg/dL   Alkaline Phosphatase 71  39 - 117 U/L   AST 35  0 - 37 U/L   ALT 54 (*) 0 - 53 U/L    Total Protein 6.9  6.0 - 8.3 g/dL   Albumin 3.9  3.5 - 5.2 g/dL   Calcium 9.1  8.4 - 10.5 mg/dL   GFR 95.36  >60.00 mL/min  HEMOGLOBIN A1C      Result Value Ref Range   Hemoglobin A1C 7.2 (*) 4.6 - 6.5 %  MICROALBUMIN / CREATININE URINE RATIO      Result Value Ref Range   Microalb, Ur 2.8 (*) 0.0 - 1.9 mg/dL   Creatinine,U 176.0     Microalb Creat Ratio 1.6  0.0 - 30.0 mg/g  LIPID PANEL      Result Value Ref Range   Cholesterol 137  0 - 200 mg/dL   Triglycerides 98.0  0.0 - 149.0 mg/dL   HDL 36.20 (*) >39.00 mg/dL   VLDL 19.6  0.0 - 40.0 mg/dL   LDL Cholesterol 81  0 - 99 mg/dL   Total CHOL/HDL Ratio 4     NonHDL 100.80        Assessment & Plan:   Problem List Items Addressed This Visit   Smoker     Continue to encourage cessation - precontemplative. Did not tolerate vapes. Discussed NRT and recruiting wife to quit with him.    HLD (hyperlipidemia)     Chronic, stable. Continue simvastatin.    Dysphagia     Actually improved off actos plusmet    Diabetes mellitus type 2, uncontrolled, without complications - Primary     Chronic, stable with recent changes. Now on amaryl 88m 1/2 tab bid with lunch/dinner and metformin 10073mbid. DPP4-I family too expensive esp now he's planning on going self pay.    Relevant Medications      glimepiride (AMARYL) 1 MG tablet   Other Relevant Orders      Hemoglobin A1c       Follow up plan: Return as needed.

## 2013-12-11 LAB — HM DIABETES EYE EXAM

## 2014-01-16 ENCOUNTER — Ambulatory Visit (INDEPENDENT_AMBULATORY_CARE_PROVIDER_SITE_OTHER): Payer: No Typology Code available for payment source | Admitting: Family Medicine

## 2014-01-16 ENCOUNTER — Encounter: Payer: Self-pay | Admitting: Family Medicine

## 2014-01-16 VITALS — BP 136/82 | HR 92 | Temp 98.3°F | Ht 68.0 in | Wt 204.5 lb

## 2014-01-16 DIAGNOSIS — Z720 Tobacco use: Secondary | ICD-10-CM

## 2014-01-16 DIAGNOSIS — N400 Enlarged prostate without lower urinary tract symptoms: Secondary | ICD-10-CM

## 2014-01-16 DIAGNOSIS — E785 Hyperlipidemia, unspecified: Secondary | ICD-10-CM

## 2014-01-16 DIAGNOSIS — Z0000 Encounter for general adult medical examination without abnormal findings: Secondary | ICD-10-CM

## 2014-01-16 DIAGNOSIS — Z125 Encounter for screening for malignant neoplasm of prostate: Secondary | ICD-10-CM

## 2014-01-16 DIAGNOSIS — F17200 Nicotine dependence, unspecified, uncomplicated: Secondary | ICD-10-CM

## 2014-01-16 DIAGNOSIS — E663 Overweight: Secondary | ICD-10-CM | POA: Insufficient documentation

## 2014-01-16 DIAGNOSIS — E119 Type 2 diabetes mellitus without complications: Secondary | ICD-10-CM

## 2014-01-16 DIAGNOSIS — E669 Obesity, unspecified: Secondary | ICD-10-CM

## 2014-01-16 LAB — LIPID PANEL
CHOL/HDL RATIO: 3
Cholesterol: 137 mg/dL (ref 0–200)
HDL: 44 mg/dL (ref >39.00)
LDL CALC: 83 mg/dL (ref 0–99)
NonHDL: 93
Triglycerides: 51 mg/dL (ref 0.0–149.0)
VLDL: 10.2 mg/dL (ref 0.0–40.0)

## 2014-01-16 LAB — COMPREHENSIVE METABOLIC PANEL
ALK PHOS: 75 U/L (ref 39–117)
ALT: 42 U/L (ref 0–53)
AST: 39 U/L — ABNORMAL HIGH (ref 0–37)
Albumin: 4.5 g/dL (ref 3.5–5.2)
BILIRUBIN TOTAL: 1 mg/dL (ref 0.2–1.2)
BUN: 17 mg/dL (ref 6–23)
CO2: 26 mEq/L (ref 19–32)
Calcium: 9.4 mg/dL (ref 8.4–10.5)
Chloride: 105 mEq/L (ref 96–112)
Creatinine, Ser: 0.8 mg/dL (ref 0.4–1.5)
GFR: 114.47 mL/min (ref >60.00)
Glucose, Bld: 155 mg/dL — ABNORMAL HIGH (ref 70–99)
POTASSIUM: 4.7 meq/L (ref 3.5–5.1)
SODIUM: 139 meq/L (ref 135–145)
TOTAL PROTEIN: 7.2 g/dL (ref 6.0–8.3)

## 2014-01-16 LAB — PSA: PSA: 0.46 ng/mL (ref 0.10–4.00)

## 2014-01-16 NOTE — Progress Notes (Signed)
Pre visit review using our clinic review tool, if applicable. No additional management support is needed unless otherwise documented below in the visit note. 

## 2014-01-16 NOTE — Assessment & Plan Note (Signed)
Continue to encourage cessation. Contemplative. Wants to quit cold [place]. Quit for 3 d on birthday.

## 2014-01-16 NOTE — Assessment & Plan Note (Signed)
Stable exam without significant enlargement.

## 2014-01-16 NOTE — Assessment & Plan Note (Signed)
Preventative protocols reviewed and updated unless pt declined. Discussed healthy diet and lifestyle.  

## 2014-01-16 NOTE — Patient Instructions (Addendum)
Get flu shot at pharmacy. labwork today. Keep working on quitting smoking and weight loss. Good to see you today ,call us with questions.

## 2014-01-16 NOTE — Assessment & Plan Note (Signed)
Chronic, stable on current regimen.  

## 2014-01-16 NOTE — Progress Notes (Signed)
BP 136/82 mmHg  Pulse 92  Temp(Src) 98.3 F (36.8 C) (Oral)  Ht 5\' 8"  (1.727 m)  Wt 204 lb 8 oz (92.761 kg)  BMI 31.10 kg/m2   CC: CPE  Subjective:    Patient ID: Logan Tanner, male    DOB: 11/09/1955, 58 y.o.   MRN: [id number]  HPI: Logan Tanner is a 58 y.o. male presenting on 01/16/2014 for Annual Exam   Here for CPE prior to changing to self pay in new year. Weight loss continues - working on this. Body mass index is 31.1 kg/(m^2). DM - sugars well controlled on current regimen. Recent CDL physical. Known high frequency hearing loss.  Preventative: COLONOSCOPY Date: 01/2012 2 diminutive polyps, mod diverticulosis, rec rpt 10 yrs Carlean Purl) Prostate screening - PSA WNL 02/2011. continue Flu shot - at pharmacy.  Pneumovax 02/2011.  Tetanus - 02/2010.   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 Niel Hummer ([employer] [facility]-) Activity: no regular exercise, stays active around the house  Relevant past medical, surgical, family and social history reviewed and updated as indicated. Interim medical history since our last visit reviewed. Allergies and medications reviewed and updated.  Current Outpatient Prescriptions on File Prior to Visit  Medication Sig  . aspirin 81 MG tablet Take 81 mg by mouth daily.    Marland Kitchen glimepiride (AMARYL) 1 MG tablet Take 1 mg by mouth daily with supper.   Marland Kitchen glucose blood test strip 1 each by Other route 2 (two) times daily as needed for other. Please schedule an appointment with Dr, having fasting labs prior for any refills  . metFORMIN (GLUCOPHAGE) 1000 MG tablet Take 1 tablet (1,000 mg total) by mouth 2 (two) times daily with a meal.  . naproxen sodium (ANAPROX) 220 MG tablet Take 220 mg by mouth as needed.   Marland Kitchen omeprazole (PRILOSEC) 40 MG capsule Take 1 capsule (40 mg total) by mouth daily. (Patient taking differently: Take 40 mg  by mouth daily as needed. )  . simvastatin (ZOCOR) 20 MG tablet Take one tablet daily at bedtime.   No current facility-administered medications on file prior to visit.    Review of Systems  Constitutional: Negative for fever, chills, activity change, appetite change, fatigue and unexpected weight change.  HENT: Negative for hearing loss.   Eyes: Negative for visual disturbance.  Respiratory: Negative for cough, chest tightness, shortness of breath and wheezing.   Cardiovascular: Negative for chest pain, palpitations and leg swelling.  Gastrointestinal: Negative for nausea, vomiting, abdominal pain, diarrhea, constipation, blood in stool and abdominal distention.  Genitourinary: Negative for hematuria and difficulty urinating.  Musculoskeletal: Negative for myalgias, arthralgias and neck pain.       Leg cramps  Skin: Negative for rash.  Neurological: Negative for dizziness, seizures, syncope and headaches.  Hematological: Negative for adenopathy. Does not bruise/bleed easily.  Psychiatric/Behavioral: Negative for dysphoric mood. The patient is not nervous/anxious.    Per HPI unless specifically indicated above     Objective:    BP 136/82 mmHg  Pulse 92  Temp(Src) 98.3 F (36.8 C) (Oral)  Ht 5\' 8"  (1.727 m)  Wt 204 lb 8 oz (92.761 kg)  BMI 31.10 kg/m2  Wt Readings from Last 3 Encounters:  01/16/14 204 lb 8 oz (92.761 kg)  11/14/13 209 lb 8 oz (95.029 kg)  07/14/13 215 lb 8 oz (97.75 kg)    Physical Exam  Constitutional: He is  oriented to person, place, and time. He appears well-developed and well-nourished. No distress.  HENT:  Head: Normocephalic and atraumatic.  Right Ear: Hearing, tympanic membrane, external ear and ear canal normal.  Left Ear: Hearing, tympanic membrane, external ear and ear canal normal.  Nose: Nose normal.  Mouth/Throat: Uvula is midline, oropharynx is clear and moist and mucous membranes are normal. No oropharyngeal exudate, posterior oropharyngeal  edema or posterior oropharyngeal erythema.  Eyes: Conjunctivae and EOM are normal. Pupils are equal, round, and reactive to light. No scleral icterus.  Neck: Normal range of motion. Neck supple. No thyromegaly present.  Cardiovascular: Normal rate, regular rhythm, normal heart sounds and intact distal pulses.   No murmur heard. Pulses:      Radial pulses are 2+ on the right side, and 2+ on the left side.  Pulmonary/Chest: Effort normal and breath sounds normal. No respiratory distress. He has no wheezes. He has no rales.  Abdominal: Soft. Bowel sounds are normal. He exhibits no distension and no mass. There is no tenderness. There is no rebound and no guarding.  Genitourinary: Rectum normal and prostate normal. Rectal exam shows no external hemorrhoid, no internal hemorrhoid, no fissure, no mass, no tenderness and anal tone normal. Prostate is not enlarged (10-15gm) and not tender.  Musculoskeletal: Normal range of motion. He exhibits no edema.  Lymphadenopathy:    He has no cervical adenopathy.  Neurological: He is alert and oriented to person, place, and time.  CN grossly intact, station and gait intact  Skin: Skin is warm and dry. No rash noted.  Psychiatric: He has a normal mood and affect. His behavior is normal. Judgment and thought content normal.  Nursing note and vitals reviewed.  Results for orders placed or performed in visit on 01/16/14  HM DIABETES EYE EXAM  Result Value Ref Range   HM Diabetic Eye Exam No Retinopathy No Retinopathy      Assessment & Plan:   Problem List Items Addressed This Visit    Smoker    Continue to encourage cessation. Contemplative. Wants to quit cold [place]. Quit for 3 d on birthday.    Obesity    Congratulated on continued weight loss noted.    HLD (hyperlipidemia)    Chronic, stable. Continue simvastatin.    Relevant Orders      Lipid panel      Comprehensive metabolic panel   Healthcare maintenance - Primary    Preventative protocols  reviewed and updated unless pt declined. Discussed healthy diet and lifestyle.    Diabetes mellitus type 2, controlled, without complications    Chronic, stable on current regimen.    BPH (benign prostatic hypertrophy)    Stable exam without significant enlargement.      Other Visit Diagnoses    Special screening for malignant neoplasm of prostate        Relevant Orders       PSA        Follow up plan: Return in about 1 year (around 01/17/2015), or as needed, for annual exam, prior fasting for blood work.

## 2014-01-16 NOTE — Assessment & Plan Note (Signed)
Chronic, stable. Continue simvastatin.  

## 2014-01-16 NOTE — Assessment & Plan Note (Signed)
Congratulated on continued weight loss noted.

## 2014-01-16 NOTE — Assessment & Plan Note (Deleted)
Stable exam.

## 2014-01-17 ENCOUNTER — Encounter: Payer: Self-pay | Admitting: *Deleted

## 2014-01-26 ENCOUNTER — Other Ambulatory Visit: Payer: Self-pay | Admitting: *Deleted

## 2014-01-26 MED ORDER — SIMVASTATIN 20 MG PO TABS: ORAL_TABLET | ORAL | Status: DC

## 2014-06-13 ENCOUNTER — Telehealth: Payer: Self-pay | Admitting: *Deleted

## 2014-06-13 MED ORDER — CO Q 10 100 MG PO CAPS: 1.0000 | ORAL_CAPSULE | Freq: Every day | ORAL | Status: AC

## 2014-06-13 NOTE — Telephone Encounter (Signed)
As long as he's had 3 tetanus shots in his lifetime, should be ok without another one for now. But watch for signs of infection and will need to be seen right away if that happens.

## 2014-06-13 NOTE — Telephone Encounter (Signed)
Patient notified and verbalized understanding. He mentioned that he had started having muscle aches in his legs and thought that the simvastatin may be causing them. He stopped the med for the last 2 weeks and the aches have decreased but have not completely stopped. I advised to restart the med and see if the aches come back and if they do to call me and let me know. He verbalized understanding.

## 2014-06-13 NOTE — Telephone Encounter (Signed)
Patient called wanting to know when he had his last tetanus vaccine. Advised patient that his last tetanus was 03/07/10.  Patient stated that he stepped on a 3-4 inch staple at work yesterday that went into his boot. Patient stated that when he got home he cleaned his foot real good, used peroxide and neosporin. Patient stated that his foot is swollen today and figured he would see how it does over the next few days, but wants to know if he needs another tetanus because of this incident?

## 2014-06-13 NOTE — Telephone Encounter (Signed)
Also recommend he start CoQ10 100mg  daily.

## 2014-06-14 NOTE — Telephone Encounter (Signed)
Patient notified

## 2014-08-28 ENCOUNTER — Telehealth: Payer: Self-pay | Admitting: Family Medicine

## 2014-08-28 NOTE — Telephone Encounter (Signed)
Received fax refill request.

## 2014-08-30 NOTE — Telephone Encounter (Signed)
Enter in error

## 2014-08-31 ENCOUNTER — Other Ambulatory Visit: Payer: Self-pay

## 2014-08-31 MED ORDER — GLIMEPIRIDE 1 MG PO TABS: 1.0000 mg | ORAL_TABLET | Freq: Every day | ORAL | Status: DC

## 2014-09-07 ENCOUNTER — Telehealth: Payer: Self-pay | Admitting: *Deleted

## 2014-09-07 MED ORDER — METFORMIN HCL 1000 MG PO TABS: 1000.0000 mg | ORAL_TABLET | Freq: Two times a day (BID) | ORAL | Status: DC

## 2014-09-07 NOTE — Telephone Encounter (Signed)
Pt cb and was advised by Robin at front desk that refill already at [facility]. Pt will ck with pharmacy.

## 2014-09-07 NOTE — Telephone Encounter (Signed)
Pt left v/m requesting status of metformin refill; left message requesting pt to cb. (refill already sent to [facility]).

## 2014-09-11 ENCOUNTER — Telehealth: Payer: Self-pay | Admitting: Family Medicine

## 2014-09-11 LAB — HM DIABETES EYE EXAM

## 2014-09-11 NOTE — Telephone Encounter (Signed)
Patient returned Regina's call.  Call [phone] before 5.

## 2014-09-11 NOTE — Telephone Encounter (Signed)
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 He could alternatively check with current facility to see if they would accept basic hearing screen from [place] - but I don't know if they would.

## 2014-09-11 NOTE — Telephone Encounter (Signed)
Left message with patient's wife to have him call the office back.

## 2014-09-11 NOTE — Telephone Encounter (Signed)
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 He wanted to know if we could do the hearing portion here or if he what he would need to do since we no longer do the CDL physicals.

## 2014-09-12 NOTE — Telephone Encounter (Signed)
Patient notified and verbalized understanding. 

## 2014-09-12 NOTE — Telephone Encounter (Signed)
Pt returned call, please call back at [phone] this morning thanks

## 2014-09-28 ENCOUNTER — Other Ambulatory Visit: Payer: Self-pay | Admitting: *Deleted

## 2014-09-28 MED ORDER — GLIMEPIRIDE 1 MG PO TABS: 1.0000 mg | ORAL_TABLET | Freq: Every day | ORAL | Status: DC

## 2015-01-19 ENCOUNTER — Encounter: Payer: No Typology Code available for payment source | Admitting: Family Medicine

## 2015-01-24 ENCOUNTER — Other Ambulatory Visit: Payer: Self-pay | Admitting: *Deleted

## 2015-01-24 MED ORDER — GLIMEPIRIDE 1 MG PO TABS: 1.0000 mg | ORAL_TABLET | Freq: Every day | ORAL | Status: DC

## 2015-03-06 ENCOUNTER — Other Ambulatory Visit: Payer: Self-pay | Admitting: *Deleted

## 2015-03-06 MED ORDER — METFORMIN HCL 1000 MG PO TABS: 1000.0000 mg | ORAL_TABLET | Freq: Two times a day (BID) | ORAL | Status: DC

## 2015-03-07 ENCOUNTER — Other Ambulatory Visit: Payer: Self-pay | Admitting: Family Medicine

## 2015-04-25 ENCOUNTER — Encounter: Payer: No Typology Code available for payment source | Admitting: Family Medicine

## 2015-06-06 ENCOUNTER — Other Ambulatory Visit: Payer: Self-pay | Admitting: Family Medicine

## 2015-06-11 ENCOUNTER — Other Ambulatory Visit: Payer: Self-pay | Admitting: Family Medicine

## 2015-07-10 ENCOUNTER — Other Ambulatory Visit: Payer: Self-pay | Admitting: Family Medicine

## 2015-08-01 ENCOUNTER — Other Ambulatory Visit: Payer: Self-pay | Admitting: Family Medicine

## 2015-08-01 DIAGNOSIS — N400 Enlarged prostate without lower urinary tract symptoms: Secondary | ICD-10-CM

## 2015-08-01 DIAGNOSIS — E119 Type 2 diabetes mellitus without complications: Secondary | ICD-10-CM

## 2015-08-01 DIAGNOSIS — Z1159 Encounter for screening for other viral diseases: Secondary | ICD-10-CM

## 2015-08-01 DIAGNOSIS — E785 Hyperlipidemia, unspecified: Secondary | ICD-10-CM

## 2015-08-02 ENCOUNTER — Other Ambulatory Visit (INDEPENDENT_AMBULATORY_CARE_PROVIDER_SITE_OTHER): Payer: Self-pay

## 2015-08-02 DIAGNOSIS — E119 Type 2 diabetes mellitus without complications: Secondary | ICD-10-CM

## 2015-08-02 DIAGNOSIS — Z1159 Encounter for screening for other viral diseases: Secondary | ICD-10-CM

## 2015-08-02 DIAGNOSIS — N400 Enlarged prostate without lower urinary tract symptoms: Secondary | ICD-10-CM

## 2015-08-02 DIAGNOSIS — E785 Hyperlipidemia, unspecified: Secondary | ICD-10-CM

## 2015-08-02 LAB — COMPREHENSIVE METABOLIC PANEL
ALBUMIN: 4.1 g/dL (ref 3.5–5.2)
ALK PHOS: 65 U/L (ref 39–117)
ALT: 25 U/L (ref 0–53)
AST: 16 U/L (ref 0–37)
BILIRUBIN TOTAL: 0.5 mg/dL (ref 0.2–1.2)
BUN: 11 mg/dL (ref 6–23)
CALCIUM: 9 mg/dL (ref 8.4–10.5)
CO2: 29 mEq/L (ref 19–32)
Chloride: 108 mEq/L (ref 96–112)
Creatinine, Ser: 0.73 mg/dL (ref 0.40–1.50)
GFR: 117.45 mL/min (ref >60.00)
Glucose, Bld: 208 mg/dL — ABNORMAL HIGH (ref 70–99)
Potassium: 4.6 mEq/L (ref 3.5–5.1)
Sodium: 138 mEq/L (ref 135–145)
TOTAL PROTEIN: 6.3 g/dL (ref 6.0–8.3)

## 2015-08-02 LAB — LIPID PANEL
CHOL/HDL RATIO: 3
Cholesterol: 132 mg/dL (ref 0–200)
HDL: 40.8 mg/dL (ref >39.00)
LDL Cholesterol: 79 mg/dL (ref 0–99)
NonHDL: 91.53
Triglycerides: 64 mg/dL (ref 0.0–149.0)
VLDL: 12.8 mg/dL (ref 0.0–40.0)

## 2015-08-02 LAB — HEMOGLOBIN A1C: HEMOGLOBIN A1C: 8.5 % — AB (ref 4.6–6.5)

## 2015-08-02 LAB — MICROALBUMIN / CREATININE URINE RATIO
CREATININE, U: 181.7 mg/dL
MICROALB UR: 6.1 mg/dL — AB (ref 0.0–1.9)
MICROALB/CREAT RATIO: 3.4 mg/g (ref 0.0–30.0)

## 2015-08-02 LAB — PSA: PSA: 0.57 ng/mL (ref 0.10–4.00)

## 2015-08-03 LAB — HEPATITIS C ANTIBODY: HCV AB: NEGATIVE

## 2015-08-08 ENCOUNTER — Encounter: Payer: Self-pay | Admitting: Family Medicine

## 2015-08-08 ENCOUNTER — Ambulatory Visit (INDEPENDENT_AMBULATORY_CARE_PROVIDER_SITE_OTHER): Payer: Self-pay | Admitting: Family Medicine

## 2015-08-08 VITALS — BP 132/80 | HR 80 | Temp 98.1°F | Ht 66.0 in | Wt 200.2 lb

## 2015-08-08 DIAGNOSIS — Z0000 Encounter for general adult medical examination without abnormal findings: Secondary | ICD-10-CM

## 2015-08-08 DIAGNOSIS — B078 Other viral warts: Secondary | ICD-10-CM

## 2015-08-08 DIAGNOSIS — E785 Hyperlipidemia, unspecified: Secondary | ICD-10-CM

## 2015-08-08 DIAGNOSIS — N400 Enlarged prostate without lower urinary tract symptoms: Secondary | ICD-10-CM

## 2015-08-08 DIAGNOSIS — IMO0001 Reserved for inherently not codable concepts without codable children: Secondary | ICD-10-CM

## 2015-08-08 DIAGNOSIS — E669 Obesity, unspecified: Secondary | ICD-10-CM

## 2015-08-08 DIAGNOSIS — L570 Actinic keratosis: Secondary | ICD-10-CM

## 2015-08-08 DIAGNOSIS — E1165 Type 2 diabetes mellitus with hyperglycemia: Secondary | ICD-10-CM

## 2015-08-08 MED ORDER — GLIMEPIRIDE 1 MG PO TABS: ORAL_TABLET | ORAL | Status: DC

## 2015-08-08 MED ORDER — METFORMIN HCL 1000 MG PO TABS: 1000.0000 mg | ORAL_TABLET | Freq: Two times a day (BID) | ORAL | Status: DC

## 2015-08-08 NOTE — Assessment & Plan Note (Signed)
Preventative protocols reviewed and updated unless pt declined. Discussed healthy diet and lifestyle.  

## 2015-08-08 NOTE — Assessment & Plan Note (Addendum)
Some increased frequency.  DRE/PSA stable.

## 2015-08-08 NOTE — Patient Instructions (Addendum)
Look at diabetes.org for further resources.  Decrease carbs in diet. Continue good water.  Fasting sugar goal between 70-120. Goal 2 hours after meal <180.  Try simvastatin every other day to see if improvement in leg cramping. Update me prior to next refill to see if we need to cahnge to less potent statin.  Good to see you today, call us with questions.  Return in 6 months for diabetes follow up - let us know if ongoing fatigue for further labwork.  Health Maintenance, Male A healthy lifestyle and preventative care can promote health and wellness.  Maintain regular health, dental, and eye exams.  Eat a healthy diet. Foods like vegetables, fruits, whole grains, low-fat dairy products, and lean protein foods contain the nutrients you need and are low in calories. Decrease your intake of foods high in solid fats, added sugars, and salt. Get information about a proper diet from your health care provider, if necessary.  Regular physical exercise is one of the most important things you can do for your health. Most adults should get at least 150 minutes of moderate-intensity exercise (any activity that increases your heart rate and causes you to sweat) each week. In addition, most adults need muscle-strengthening exercises on 2 or more days a week.   Maintain a healthy weight. The body mass index (BMI) is a screening tool to identify possible weight problems. It provides an estimate of body fat based on height and weight. Your health care provider can find your BMI and can help you achieve or maintain a healthy weight. For males 20 years and older:  A BMI below 18.5 is considered underweight.  A BMI of 18.5 to 24.9 is normal.  A BMI of 25 to 29.9 is considered overweight.  A BMI of 30 and above is considered obese.  Maintain normal blood lipids and cholesterol by exercising and minimizing your intake of saturated fat. Eat a balanced diet with plenty of fruits and vegetables. Blood tests for  lipids and cholesterol should begin at age 5 and be repeated every 5 years. If your lipid or cholesterol levels are high, you are over age 27, or you are at high risk for heart disease, you may need your cholesterol levels checked more frequently.Ongoing high lipid and cholesterol levels should be treated with medicines if diet and exercise are not working.  If you smoke, find out from your health care provider how to quit. If you do not use tobacco, do not start.  Lung cancer screening is recommended for adults aged 60-80 years who are at high risk for developing lung cancer because of a history of smoking. A yearly low-dose CT scan of the lungs is recommended for people who have at least a 30-pack-year history of smoking and are current smokers or have quit within the past 15 years. A pack year of smoking is smoking an average of 1 pack of cigarettes a day for 1 year (for example, a 30-pack-year history of smoking could mean smoking 1 pack a day for 30 years or 2 packs a day for 15 years). Yearly screening should continue until the smoker has stopped smoking for at least 15 years. Yearly screening should be stopped for people who develop a health problem that would prevent them from having lung cancer treatment.  If you choose to drink alcohol, do not have more than 2 drinks per day. One drink is considered to be 12 oz (360 mL) of beer, 5 oz (150 mL) of wine, or  1.5 oz (45 mL) of liquor.  Avoid the use of street drugs. Do not share needles with anyone. Ask for help if you need support or instructions about stopping the use of drugs.  High blood pressure causes heart disease and increases the risk of stroke. High blood pressure is more likely to develop in:  People who have blood pressure in the end of the normal range (100-139/85-89 mm Hg).  People who are overweight or obese.  People who are African American.  If you are 53-58 years of age, have your blood pressure checked every 3-5 years. If  you are 35 years of age or older, have your blood pressure checked every year. You should have your blood pressure measured twice--once when you are at a hospital or clinic, and once when you are not at a hospital or clinic. Record the average of the two measurements. To check your blood pressure when you are not at a hospital or clinic, you can use:  An automated blood pressure machine at a pharmacy.  A home blood pressure monitor.  If you are 6-52 years old, ask your health care provider if you should take aspirin to prevent heart disease.  Diabetes screening involves taking a blood sample to check your fasting blood sugar level. This should be done once every 3 years after age 1 if you are at a normal weight and without risk factors for diabetes. Testing should be considered at a younger age or be carried out more frequently if you are overweight and have at least 1 risk factor for diabetes.  Colorectal cancer can be detected and often prevented. Most routine colorectal cancer screening begins at the age of 70 and continues through age 37. However, your health care provider may recommend screening at an earlier age if you have risk factors for colon cancer. On a yearly basis, your health care provider may provide home test kits to check for hidden blood in the stool. A small camera at the end of a tube may be used to directly examine the colon (sigmoidoscopy or colonoscopy) to detect the earliest forms of colorectal cancer. Talk to your health care provider about this at age 9 when routine screening begins. A direct exam of the colon should be repeated every 5-10 years through age 24, unless early forms of precancerous polyps or small growths are found.  People who are at an increased risk for hepatitis B should be screened for this virus. You are considered at high risk for hepatitis B if:  You were born in a country where hepatitis B occurs often. Talk with your health care provider about which  countries are considered high risk.  Your parents were born in a high-risk country and you have not received a shot to protect against hepatitis B (hepatitis B vaccine).  You have HIV or AIDS.  You use needles to inject street drugs.  You live with, or have sex with, someone who has hepatitis B.  You are a man who has sex with other men (MSM).  You get hemodialysis treatment.  You take certain medicines for conditions like cancer, organ transplantation, and autoimmune conditions.  Hepatitis C blood testing is recommended for all people born from [birth year] through 1963 and any individual with known risk factors for hepatitis C.  Healthy men should no longer receive prostate-specific antigen (PSA) blood tests as part of routine cancer screening. Talk to your health care provider about prostate cancer screening.  Testicular cancer screening is not  recommended for adolescents or adult males who have no symptoms. Screening includes self-exam, a health care provider exam, and other screening tests. Consult with your health care provider about any symptoms you have or any concerns you have about testicular cancer.  Practice safe sex. Use condoms and avoid high-risk sexual practices to reduce the spread of sexually transmitted infections (STIs).  You should be screened for STIs, including gonorrhea and chlamydia if:  You are sexually active and are younger than 24 years.  You are older than 24 years, and your health care provider tells you that you are at risk for this type of infection.  Your sexual activity has changed since you were last screened, and you are at an increased risk for chlamydia or gonorrhea. Ask your health care provider if you are at risk.  If you are at risk of being infected with HIV, it is recommended that you take a prescription medicine daily to prevent HIV infection. This is called pre-exposure prophylaxis (PrEP). You are considered at risk if:  You are a man who has  sex with other men (MSM).  You are a heterosexual man who is sexually active with multiple partners.  You take drugs by injection.  You are sexually active with a partner who has HIV.  Talk with your health care provider about whether you are at high risk of being infected with HIV. If you choose to begin PrEP, you should first be tested for HIV. You should then be tested every 3 months for as long as you are taking PrEP.  Use sunscreen. Apply sunscreen liberally and repeatedly throughout the day. You should seek shade when your shadow is shorter than you. Protect yourself by wearing long sleeves, pants, a wide-brimmed hat, and sunglasses year round whenever you are outdoors.  Tell your health care provider of new moles or changes in moles, especially if there is a change in shape or color. Also, tell your health care provider if a mole is larger than the size of a pencil eraser.  A one-time screening for abdominal aortic aneurysm (AAA) and surgical repair of large AAAs by ultrasound is recommended for men aged 59-75 years who are current or former smokers.  Stay current with your vaccines (immunizations).   This information is not intended to replace advice given to you by your health care provider. Make sure you discuss any questions you have with your health care provider.   Document Released: 07/26/2007 Document Revised: 02/17/2014 Document Reviewed: 06/24/2010 Elsevier Interactive Patient Education [employer].

## 2015-08-08 NOTE — Assessment & Plan Note (Signed)
Discussed healthy diet and lifestyle changes to affect sustainable weight loss  

## 2015-08-08 NOTE — Assessment & Plan Note (Addendum)
Chronic, stable. Simvastatin induced myalgias despite coq10 - will change dosing to QOD. If ongoing, change to less potent statin.

## 2015-08-08 NOTE — Assessment & Plan Note (Signed)
Chronic, deteriorated. Pt attributes to increased carb intake. Motivated to work on diet changes over next 6 months and then will reassess control at f/u visit.  UTD eye exam. Foot exam today.

## 2015-08-08 NOTE — Progress Notes (Signed)
Pre visit review using our clinic review tool, if applicable. No additional management support is needed unless otherwise documented below in the visit note. 

## 2015-08-08 NOTE — Progress Notes (Signed)
BP 132/80 mmHg  Pulse 80  Temp(Src) 98.1 F (36.7 C) (Oral)  Ht 5\' 6"  (1.676 m)  Wt 200 lb 4 oz (90.833 kg)  BMI 32.34 kg/m2  SpO2 94%   CC: CPE  Subjective:    Patient ID: Logan Tanner, male    DOB: 29-Jul-1955, 60 y.o.   MRN: [id number]  HPI: Logan Tanner is a 60 y.o. male presenting on 08/08/2015 for Annual Exam   Self pay. Leg cramps from simvastatin despite CoQ 10.  DM - compliant with metformin and amaryl. Notes some hypoglycemia with amaryl. Discussed healthy diet. Does not check sugars. Last eye exam 09/11/2014 - no retinopathy.  Diabetic Foot Exam - Simple   Simple Foot Form  Diabetic Foot exam was performed with the following findings:  Yes 08/08/2015  9:35 AM  Visual Inspection  No deformities, no ulcerations, no other skin breakdown bilaterally:  Yes  Sensation Testing  Intact to touch and monofilament testing bilaterally:  Yes  Pulse Check  Posterior Tibialis and Dorsalis pulse intact bilaterally:  Yes  Comments      Continued smoker.   Preventative: COLONOSCOPY Date: 01/2012 2 diminutive polyps, mod diverticulosis, rec rpt 10 yrs Carlean Purl)  Prostate screening - PSA WNL 02/2011. Continue screening  Flu shot - does not receive Pneumovax 02/2011  Tetanus - 02/2010 Seat belt use discussed Sunscreen use discussed. No changing moles on skin.   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 Interim medical history since our last visit reviewed. Allergies and medications reviewed and updated. Current Outpatient Prescriptions on File Prior to Visit  Medication Sig  . aspirin 81 MG tablet Take 81 mg by mouth daily.    . Coenzyme Q10 (CO Q  10) 100 MG CAPS Take 1 capsule by mouth daily.  Marland Kitchen glucose blood test strip 1 each by Other route 2 (two) times daily as needed for other. Please schedule an appointment with Dr, having fasting labs prior for any refills  . naproxen sodium (ANAPROX) 220 MG tablet Take 220 mg by mouth as needed.   Marland Kitchen omeprazole (PRILOSEC) 40 MG capsule Take 1 capsule (40 mg total) by mouth daily. (Patient taking differently: Take 40 mg by mouth daily as needed. )   No current facility-administered medications on file prior to visit.    Review of Systems  Constitutional: Negative for fever, chills, activity change, appetite change, fatigue and unexpected weight change.  HENT: Negative for hearing loss.   Eyes: Negative for visual disturbance.  Respiratory: Negative for cough, chest tightness, shortness of breath and wheezing.   Cardiovascular: Negative for chest pain, palpitations and leg swelling.  Gastrointestinal: Negative for nausea, vomiting, abdominal pain, diarrhea, constipation, blood in stool and abdominal distention.  Genitourinary: Negative for hematuria and difficulty urinating.  Musculoskeletal: Negative for myalgias, arthralgias and neck pain.  Skin: Negative for rash.  Neurological: Negative for dizziness, seizures, syncope and headaches.  Hematological: Negative for adenopathy. Does not bruise/bleed easily.  Psychiatric/Behavioral: Negative for dysphoric mood. The patient is not nervous/anxious.    Per HPI unless specifically indicated in ROS section     Objective:    BP 132/80 mmHg  Pulse 80  Temp(Src) 98.1 F (36.7 C) (Oral)  Ht 5\' 6"  (1.676  m)  Wt 200 lb 4 oz (90.833 kg)  BMI 32.34 kg/m2  SpO2 94%  Wt Readings from Last 3 Encounters:  08/08/15 200 lb 4 oz (90.833 kg)  01/16/14 204 lb 8 oz (92.761 kg)  11/14/13 209 lb 8 oz (95.029 kg)    Physical Exam  Constitutional: He is oriented to person, place, and time. He appears well-developed and well-nourished. No distress.  HENT:    Head: Normocephalic and atraumatic.  Right Ear: Hearing, tympanic membrane, external ear and ear canal normal.  Left Ear: Hearing, tympanic membrane, external ear and ear canal normal.  Nose: Nose normal.  Mouth/Throat: Uvula is midline, oropharynx is clear and moist and mucous membranes are normal. No oropharyngeal exudate, posterior oropharyngeal edema or posterior oropharyngeal erythema.  Eyes: Conjunctivae and EOM are normal. Pupils are equal, round, and reactive to light. No scleral icterus.  Neck: Normal range of motion. Neck supple. No thyromegaly present.  Cardiovascular: Normal rate, regular rhythm, normal heart sounds and intact distal pulses.   No murmur heard. Pulses:      Radial pulses are 2+ on the right side, and 2+ on the left side.  Pulmonary/Chest: Effort normal and breath sounds normal. No respiratory distress. He has no wheezes. He has no rales.  Abdominal: Soft. Bowel sounds are normal. He exhibits no distension and no mass. There is no tenderness. There is no rebound and no guarding.  Genitourinary: Rectum normal. Rectal exam shows no external hemorrhoid, no internal hemorrhoid, no fissure, no mass, no tenderness and anal tone normal. Prostate is enlarged (25gm). Prostate is not tender.  Musculoskeletal: Normal range of motion. He exhibits no edema.  Lymphadenopathy:    He has no cervical adenopathy.  Neurological: He is alert and oriented to person, place, and time.  CN grossly intact, station and gait intact Foot exam as per HPI if performed  Skin: Skin is warm and dry. Rash noted.  Warts R index/3rd finger Rough spots on right temple, left cheek  Psychiatric: He has a normal mood and affect. His behavior is normal. Judgment and thought content normal.  Nursing note and vitals reviewed.  Results for orders placed or performed in visit on 08/08/15  HM DIABETES EYE EXAM  Result Value Ref Range   HM Diabetic Eye Exam No Retinopathy No Retinopathy      Assessment  & Plan:   Problem List Items Addressed This Visit    Diabetes mellitus type 2, uncontrolled ([place])    Chronic, deteriorated. Pt attributes to increased carb intake. Motivated to work on diet changes over next 6 months and then will reassess control at f/u visit.  UTD eye exam. Foot exam today.       Relevant Medications   simvastatin (ZOCOR) 20 MG tablet   glimepiride (AMARYL) 1 MG tablet   metFORMIN (GLUCOPHAGE) 1000 MG tablet   HLD (hyperlipidemia)    Chronic, stable. Simvastatin induced myalgias despite coq10 - will change dosing to QOD. If ongoing, change to less potent statin.      Relevant Medications   simvastatin (ZOCOR) 20 MG tablet   BPH (benign prostatic hypertrophy)    Some increased frequency.  DRE/PSA stable.       Common wart   Healthcare maintenance - Primary    Preventative protocols reviewed and updated unless pt declined. Discussed healthy diet and lifestyle.       Obesity, Class I, BMI 30-34.9    Discussed healthy diet and lifestyle changes to affect sustainable weight loss.  Relevant Medications   glimepiride (AMARYL) 1 MG tablet   metFORMIN (GLUCOPHAGE) 1000 MG tablet   AK (actinic keratosis)       Follow up plan: Return in about 6 months (around 02/07/2016), or as needed, for follow up visit.  Ria Bush, MD

## 2015-09-11 LAB — HM DIABETES EYE EXAM

## 2016-02-08 ENCOUNTER — Ambulatory Visit: Payer: Self-pay | Admitting: Family Medicine

## 2016-02-08 DIAGNOSIS — Z0289 Encounter for other administrative examinations: Secondary | ICD-10-CM

## 2016-06-26 ENCOUNTER — Other Ambulatory Visit: Payer: Self-pay | Admitting: Family Medicine

## 2016-08-21 ENCOUNTER — Other Ambulatory Visit: Payer: Self-pay | Admitting: Family Medicine

## 2016-09-25 ENCOUNTER — Other Ambulatory Visit: Payer: Self-pay | Admitting: Family Medicine

## 2016-09-25 NOTE — Telephone Encounter (Signed)
Last A1C 07/2015. pls advise

## 2016-10-16 ENCOUNTER — Telehealth: Payer: Self-pay | Admitting: Family Medicine

## 2016-10-16 NOTE — Telephone Encounter (Signed)
Pt has appt with Dr Darnell Level on 10/17/16 at 11:15.

## 2016-10-16 NOTE — Telephone Encounter (Signed)
[place] [facility] - Client [facility] Patient Name: Logan Tanner DOB: [birth year] Initial Comment Caller has vertigo and needs appt for blood work. Nurse Assessment Nurse: Jimmey Ralph, RN, Lissa Date/Time (Eastern Time): 10/16/2016 4:09:50 PM Confirm and document reason for call. If symptomatic, describe symptoms. ---Caller has vertigo and needs appt for blood work. I worked today and I am medication for vertigo and it isn't helping. No vomiting and no pain. Does the patient have any new or worsening symptoms? ---Yes Will a triage be completed? ---Yes Related visit to physician within the last 2 weeks? ---No Does the PT have any chronic conditions? (i.e. diabetes, asthma, etc.) ---Yes List chronic conditions. ---Diabetic and I haven't check today and running 119/120s Is this a behavioral health or substance abuse call? ---No Guidelines Guideline Title Affirmed Question Affirmed Notes Dizziness - Vertigo [1] MODERATE dizziness (e.g., vertigo; feels very unsteady, interferes with normal activities) AND [2] has NOT been evaluated by physician for this Final Disposition User See Physician within 24 Hours Hammonds, RN, Lissa Comments I have appointment tomorrow at 11am and an appointment and blood work. Referrals REFERREDTO PCP OFFICE Disagree/Comply: Comply  Call Id: [phone]

## 2016-10-17 ENCOUNTER — Ambulatory Visit: Payer: Self-pay | Admitting: Family Medicine

## 2016-10-29 ENCOUNTER — Other Ambulatory Visit: Payer: Self-pay | Admitting: Family Medicine

## 2016-10-29 ENCOUNTER — Other Ambulatory Visit (INDEPENDENT_AMBULATORY_CARE_PROVIDER_SITE_OTHER): Payer: Self-pay

## 2016-10-29 DIAGNOSIS — E1165 Type 2 diabetes mellitus with hyperglycemia: Secondary | ICD-10-CM

## 2016-10-29 DIAGNOSIS — IMO0001 Reserved for inherently not codable concepts without codable children: Secondary | ICD-10-CM

## 2016-10-29 DIAGNOSIS — N400 Enlarged prostate without lower urinary tract symptoms: Secondary | ICD-10-CM

## 2016-10-29 DIAGNOSIS — E785 Hyperlipidemia, unspecified: Secondary | ICD-10-CM

## 2016-10-29 LAB — LIPID PANEL
CHOL/HDL RATIO: 3
Cholesterol: 187 mg/dL (ref 0–200)
HDL: 56.7 mg/dL (ref >39.00)
LDL Cholesterol: 105 mg/dL — ABNORMAL HIGH (ref 0–99)
NonHDL: 130.41
TRIGLYCERIDES: 126 mg/dL (ref 0.0–149.0)
VLDL: 25.2 mg/dL (ref 0.0–40.0)

## 2016-10-29 LAB — BASIC METABOLIC PANEL
BUN: 15 mg/dL (ref 6–23)
CALCIUM: 9.7 mg/dL (ref 8.4–10.5)
CHLORIDE: 104 meq/L (ref 96–112)
CO2: 31 meq/L (ref 19–32)
Creatinine, Ser: 0.8 mg/dL (ref 0.40–1.50)
GFR: 105.22 mL/min (ref >60.00)
GLUCOSE: 206 mg/dL — AB (ref 70–99)
POTASSIUM: 5 meq/L (ref 3.5–5.1)
SODIUM: 140 meq/L (ref 135–145)

## 2016-10-29 LAB — PSA: PSA: 1.47 ng/mL (ref 0.10–4.00)

## 2016-10-29 LAB — HEMOGLOBIN A1C: Hgb A1c MFr Bld: 7.8 % — ABNORMAL HIGH (ref 4.6–6.5)

## 2016-10-30 ENCOUNTER — Ambulatory Visit (INDEPENDENT_AMBULATORY_CARE_PROVIDER_SITE_OTHER): Payer: Self-pay | Admitting: Family Medicine

## 2016-10-30 ENCOUNTER — Encounter: Payer: Self-pay | Admitting: Family Medicine

## 2016-10-30 VITALS — BP 118/76 | HR 83 | Temp 97.9°F | Ht 66.0 in | Wt 198.8 lb

## 2016-10-30 DIAGNOSIS — IMO0001 Reserved for inherently not codable concepts without codable children: Secondary | ICD-10-CM

## 2016-10-30 DIAGNOSIS — E66811 Obesity, class 1: Secondary | ICD-10-CM

## 2016-10-30 DIAGNOSIS — F17200 Nicotine dependence, unspecified, uncomplicated: Secondary | ICD-10-CM

## 2016-10-30 DIAGNOSIS — Z0000 Encounter for general adult medical examination without abnormal findings: Secondary | ICD-10-CM

## 2016-10-30 DIAGNOSIS — E785 Hyperlipidemia, unspecified: Secondary | ICD-10-CM

## 2016-10-30 DIAGNOSIS — E669 Obesity, unspecified: Secondary | ICD-10-CM

## 2016-10-30 DIAGNOSIS — E1165 Type 2 diabetes mellitus with hyperglycemia: Secondary | ICD-10-CM

## 2016-10-30 MED ORDER — METFORMIN HCL 1000 MG PO TABS: 1000.0000 mg | ORAL_TABLET | Freq: Two times a day (BID) | ORAL | 3 refills | Status: DC

## 2016-10-30 MED ORDER — GLIMEPIRIDE 2 MG PO TABS: 2.0000 mg | ORAL_TABLET | Freq: Every day | ORAL | 3 refills | Status: DC

## 2016-10-30 MED ORDER — SIMVASTATIN 20 MG PO TABS: 20.0000 mg | ORAL_TABLET | ORAL | 3 refills | Status: DC

## 2016-10-30 NOTE — Assessment & Plan Note (Addendum)
Chronic, stable. Continue current regimen of simvastatin QOD, coq10 as this is tolerable.  The 10-year ASCVD risk score Mikey Bussing DC Brooke Bonito., et al., 2013) is: 16.9%   Values used to calculate the score:     Age: 61 years     Sex: Male     Is Non-Hispanic African American: No     Diabetic: Yes     Tobacco smoker: Yes     Systolic Blood Pressure: 173 mmHg     Is BP treated: No     HDL Cholesterol: 56.7 mg/dL     Total Cholesterol: 187 mg/dL

## 2016-10-30 NOTE — Assessment & Plan Note (Signed)
Continue to encourage smoking cessation. 

## 2016-10-30 NOTE — Progress Notes (Signed)
BP 118/76 (BP Location: Left Arm, Patient Position: Sitting, Cuff Size: Normal)   Pulse 83   Temp 97.9 F (36.6 C) (Oral)   Ht 5\' 6"  (1.676 m)   Wt 198 lb 12 oz (90.2 kg)   SpO2 96%   BMI 32.08 kg/m    CC: CPE Subjective:    Patient ID: Logan Tanner, male    DOB: 26-Aug-1955, 61 y.o.   MRN: [id number]  HPI: Logan Tanner is a 61 y.o. male presenting on 10/30/2016 for Annual Exam   Self pay patient.   Episode of vertigo last week - now better. Tends to get this about once a year. Has meclizine for this.   DM - does not regularly check sugars. Compliant with antihyperglycemic regimen which includes: metformin 1000mg  bid and amaryl 1mg  (1/2 tab bid with meals). Denies low sugars or hypoglycemic symptoms.  Denies paresthesias. Last diabetic eye exam 09/2015.  Pneumovax: 2013.  Prevnar: not due. Lab Results  Component Value Date   HGBA1C 7.8 (H) 10/29/2016   Diabetic Foot Exam - Simple   Simple Foot Form Diabetic Foot exam was performed with the following findings:  Yes 10/30/2016 10:28 AM  Visual Inspection No deformities, no ulcerations, no other skin breakdown bilaterally:  Yes Sensation Testing Intact to touch and monofilament testing bilaterally:  Yes Pulse Check See comments:  Yes Comments Diminished pulses L DP     Preventative: COLONOSCOPY Date: 01/2012 2 diminutive polyps, mod diverticulosis, rec rpt 10 yrs Carlean Purl)  Prostate screening - yearly.  Flu shot - does not receive Pneumovax 02/2011  Tetanus - 02/2010  Seat belt use discussed Sunscreen use discussed. No changing moles on skin.  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 Interim medical history since our last visit reviewed. Allergies and medications reviewed and updated. Outpatient Medications Prior to Visit  Medication Sig Dispense Refill  . aspirin 81 MG tablet Take 81 mg by mouth daily.      . Coenzyme Q10 (CO Q 10) 100 MG CAPS Take 1 capsule by mouth daily. 30 capsule   . glucose blood test strip 1 each by Other route 2 (two) times daily as needed for other. Please schedule an appointment with Dr, having fasting labs prior for any refills 50 each 0  . naproxen sodium (ANAPROX) 220 MG tablet Take 220 mg by mouth as needed.     Marland Kitchen omeprazole (PRILOSEC) 40 MG capsule Take 1 capsule (40 mg total) by mouth daily. (Patient taking differently: Take 40 mg by mouth daily as needed. ) 30 capsule 3  . glimepiride (AMARYL) 1 MG tablet TAKE ONE TABLET BY MOUTH EVERY DAY WITH SUPPER 30 tablet 3  . metFORMIN (GLUCOPHAGE) 1000 MG tablet TAKE ONE TABLET BY MOUTH TWICE DAILY WITH MEALS 180 tablet 0  . simvastatin (ZOCOR) 20 MG tablet Take 1 tablet (20 mg total) by mouth every other day.     No facility-administered medications prior to visit.      Per HPI unless specifically indicated in ROS section below Review of Systems     Objective:    BP 118/76 (BP Location: Left Arm, Patient Position: Sitting, Cuff Size:  Normal)   Pulse 83   Temp 97.9 F (36.6 C) (Oral)   Ht 5\' 6"  (1.676 m)   Wt 198 lb 12 oz (90.2 kg)   SpO2 96%   BMI 32.08 kg/m   Wt Readings from Last 3 Encounters:  10/30/16 198 lb 12 oz (90.2 kg)  08/08/15 200 lb 4 oz (90.8 kg)  01/16/14 204 lb 8 oz (92.8 kg)    Physical Exam  Constitutional: He is oriented to person, place, and time. He appears well-developed and well-nourished. No distress.  HENT:  Head: Normocephalic and atraumatic.  Right Ear: Hearing, tympanic membrane, external ear and ear canal normal.  Left Ear: Hearing, tympanic membrane, external ear and ear canal  normal.  Nose: Nose normal.  Mouth/Throat: Uvula is midline, oropharynx is clear and moist and mucous membranes are normal. No oropharyngeal exudate, posterior oropharyngeal edema or posterior oropharyngeal erythema.  Eyes: Pupils are equal, round, and reactive to light. Conjunctivae and EOM are normal. No scleral icterus.  Neck: Normal range of motion. Neck supple.  Cardiovascular: Normal rate, regular rhythm, normal heart sounds and intact distal pulses.   No murmur heard. Pulses:      Radial pulses are 2+ on the right side, and 2+ on the left side.  Pulmonary/Chest: Effort normal and breath sounds normal. No respiratory distress. He has no wheezes. He has no rales.  Abdominal: Soft. Bowel sounds are normal. He exhibits no distension and no mass. There is no tenderness. There is no rebound and no guarding.  Genitourinary: Rectum normal and prostate normal. Rectal exam shows no external hemorrhoid, no fissure, no mass, no tenderness and anal tone normal. Prostate is not enlarged (20gm) and not tender.  Musculoskeletal: Normal range of motion. He exhibits no edema.  See HPI for foot exam if done  Lymphadenopathy:    He has no cervical adenopathy.  Neurological: He is alert and oriented to person, place, and time.  CN grossly intact, station and gait intact  Skin: Skin is warm and dry. No rash noted.  Psychiatric: He has a normal mood and affect. His behavior is normal. Judgment and thought content normal.  Nursing note and vitals reviewed.  Results for orders placed or performed in visit on 10/30/16  HM DIABETES EYE EXAM  Result Value Ref Range   HM Diabetic Eye Exam No Retinopathy No Retinopathy   Diabetic Foot Exam - Simple   Simple Foot Form Diabetic Foot exam was performed with the following findings:  Yes 10/30/2016 10:28 AM  Visual Inspection No deformities, no ulcerations, no other skin breakdown bilaterally:  Yes Sensation Testing Intact to touch and monofilament testing  bilaterally:  Yes Pulse Check See comments:  Yes Comments Diminished pulses L DP        Assessment & Plan:   Problem List Items Addressed This Visit    Diabetes mellitus type 2, uncontrolled, without complications ([place]) - Primary    Chronic, improving but still not at goal. Reviewed low carb low sugar diet. Will increase amaryl to 2mg  daily (he takes at supper largest meal of day, usually skips breakfast).       Relevant Medications   metFORMIN (GLUCOPHAGE) 1000 MG tablet   simvastatin (ZOCOR) 20 MG tablet   glimepiride (AMARYL) 2 MG tablet   Healthcare maintenance    Preventative protocols reviewed and updated unless pt declined. Discussed healthy diet and lifestyle.       HLD (hyperlipidemia)    Chronic, stable. Continue current regimen of simvastatin QOD,  coq10 as this is tolerable.  The 10-year ASCVD risk score Mikey Bussing DC Brooke Bonito., et al., 2013) is: 16.9%   Values used to calculate the score:     Age: 46 years     Sex: Male     Is Non-Hispanic African American: No     Diabetic: Yes     Tobacco smoker: Yes     Systolic Blood Pressure: 088 mmHg     Is BP treated: No     HDL Cholesterol: 56.7 mg/dL     Total Cholesterol: 187 mg/dL       Relevant Medications   simvastatin (ZOCOR) 20 MG tablet   Obesity, Class I, BMI 30-34.9    Discussed healthy diet and lifestyle changes to affect sustainable weight loss.      Relevant Medications   metFORMIN (GLUCOPHAGE) 1000 MG tablet   glimepiride (AMARYL) 2 MG tablet   Smoker    Continue to encourage smoking cessation.           Follow up plan: Return in about 6 months (around 04/29/2017) for follow up visit.  Ria Bush, MD

## 2016-10-30 NOTE — Assessment & Plan Note (Signed)
Chronic, improving but still not at goal. Reviewed low carb low sugar diet. Will increase amaryl to 2mg  daily (he takes at supper largest meal of day, usually skips breakfast).

## 2016-10-30 NOTE — Assessment & Plan Note (Signed)
Discussed healthy diet and lifestyle changes to affect sustainable weight loss  

## 2016-10-30 NOTE — Assessment & Plan Note (Signed)
Preventative protocols reviewed and updated unless pt declined. Discussed healthy diet and lifestyle.  

## 2016-10-30 NOTE — Patient Instructions (Signed)
You are doing well today Increase glimepiride to 2mg  with meal daily. Continue other medicines. Return as needed or in 6 months for diabetes.

## 2017-09-09 ENCOUNTER — Other Ambulatory Visit: Payer: Self-pay | Admitting: Family Medicine

## 2017-09-25 ENCOUNTER — Other Ambulatory Visit: Payer: Self-pay | Admitting: Family Medicine

## 2017-11-03 ENCOUNTER — Other Ambulatory Visit: Payer: Self-pay | Admitting: Family Medicine

## 2017-12-07 ENCOUNTER — Other Ambulatory Visit: Payer: Self-pay | Admitting: Family Medicine

## 2018-01-19 LAB — HM DIABETES EYE EXAM

## 2018-01-25 ENCOUNTER — Other Ambulatory Visit: Payer: Self-pay | Admitting: Family Medicine

## 2018-01-26 ENCOUNTER — Telehealth: Payer: Self-pay | Admitting: Family Medicine

## 2018-01-26 NOTE — Telephone Encounter (Signed)
Noted  

## 2018-01-26 NOTE — Telephone Encounter (Signed)
May place in open 30 min slot.

## 2018-01-26 NOTE — Telephone Encounter (Signed)
I left a message on patient's voice mail to return my call.

## 2018-01-26 NOTE — Telephone Encounter (Signed)
See 01/26/18 phone note.

## 2018-01-26 NOTE — Telephone Encounter (Signed)
Pt returned a call to office about scheduling him for his annual physical to continue to get his medication refilled. The next available day for a physical is not until 04/16/18. 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 Is there any way we can work the pt in to a 30 minute slot sooner. Please advise

## 2018-01-26 NOTE — Telephone Encounter (Signed)
Please call and schedule annual physical then send back for refill.

## 2018-01-28 NOTE — Telephone Encounter (Signed)
Appointment 12/23 Pt aware

## 2018-01-31 NOTE — Progress Notes (Signed)
BP 128/80 (BP Location: Left Arm, Patient Position: Sitting, Cuff Size: Normal)   Pulse 82   Temp 97.8 F (36.6 C) (Oral)   Ht 5\' 6"  (1.676 m)   Wt 198 lb 8 oz (90 kg)   SpO2 95%   BMI 32.04 kg/m    Hearing Screening   125Hz  250Hz  500Hz  1000Hz  2000Hz  3000Hz  4000Hz  6000Hz  8000Hz   Right ear:   25 40 0  0    Left ear:   25 40 0  0      CC: annual f/u visit Subjective:    Patient ID: Logan Tanner, male    DOB: [birth year], 62 y.o.   MRN: [id number]  HPI: Logan Tanner is a 62 y.o. male presenting on 02/01/2018 for Annual Exam   Self pay patient.  Gets yearly DOT physical for CDL.  Some ED noted.   DM - does not regularly check sugars, has been more regular this past month. Compliant with antihyperglycemic regimen which includes: metformin 1000mg  bid, amaryl 2mg  daily with lunch or dinner. Denies low sugars or hypoglycemic symptoms. Occasional hand paresthesias. Last diabetic eye exam 01/2018 - told had cataracts. Pneumovax: 2013. Prevnar: not due. Glucometer brand: bayer brand. DSME: declines.  Lab Results  Component Value Date   HGBA1C 8.5 (A) 02/01/2018   Diabetic Foot Exam - Simple   Simple Foot Form Diabetic Foot exam was performed with the following findings:  Yes 02/01/2018  3:16 PM  Visual Inspection No deformities, no ulcerations, no other skin breakdown bilaterally:  Yes Sensation Testing Intact to touch and monofilament testing bilaterally:  Yes Pulse Check Posterior Tibialis and Dorsalis pulse intact bilaterally:  Yes Comments Callus R medial great toe    Lab Results  Component Value Date   MICROALBUR 6.1 (H) 08/02/2015    Preventative: COLONOSCOPY Date: 01/2012 2 diminutive polyps, mod diverticulosis, rec rpt 10 yrs Carlean Purl)  Prostate screening - yearly. h/o BPH. Some dribbling. Nocturia x2-3. Declines medication for this.  Lung cancer screening - discussed, declines for now due to cost.  Flu shot - does not receive Pneumovax 02/2011  Tetanus -  02/2010  Seat belt use discussed  Sunscreen use discussed. No changing moles on skin but would like spots on face checked.  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 Niel Hummer ([facility]) Activity: no regular exercise, stays active around the house Diet: encouraged diabetic diet  Relevant past medical, surgical, family and social history reviewed and updated as indicated. Interim medical history since our last visit reviewed. Allergies and medications reviewed and updated. Outpatient Medications Prior to Visit  Medication Sig Dispense Refill  . aspirin 81 MG tablet Take 81 mg by mouth daily.      . Coenzyme Q10 (CO Q 10) 100 MG CAPS Take 1 capsule by mouth daily. 30 capsule   . glucose blood test strip 1 each by Other route 2 (two) times daily as needed for other. Please schedule an appointment with Dr, having fasting labs prior for any refills 50 each 0  . naproxen sodium (ANAPROX) 220 MG tablet Take 220 mg by mouth as needed.     Marland Kitchen glimepiride (AMARYL) 2 MG tablet TAKE 1 TABLET BY MOUTH ONCE DAILY 90 tablet 0  . metFORMIN (GLUCOPHAGE) 1000 MG tablet TAKE 1 TABLET BY MOUTH TWICE DAILY WITH MEALS 180  tablet 0  . omeprazole (PRILOSEC) 40 MG capsule Take 1 capsule (40 mg total) by mouth daily. (Patient taking differently: Take 40 mg by mouth daily as needed. ) 30 capsule 3  . simvastatin (ZOCOR) 20 MG tablet TAKE 1 TABLET BY MOUTH EVERY OTHER DAY 45 tablet 0   No facility-administered medications prior to visit.      Per HPI unless specifically indicated in ROS section below Review of Systems       Objective:    BP 128/80 (BP Location: Left Arm, Patient Position: Sitting, Cuff Size: Normal)   Pulse 82   Temp 97.8 F (36.6 C) (Oral)   Ht 5\' 6"  (1.676 m)   Wt 198 lb 8 oz (90 kg)   SpO2 95%   BMI  32.04 kg/m   Wt Readings from Last 3 Encounters:  02/01/18 198 lb 8 oz (90 kg)  10/30/16 198 lb 12 oz (90.2 kg)  08/08/15 200 lb 4 oz (90.8 kg)    Physical Exam Vitals signs and nursing note reviewed.  Constitutional:      General: He is not in acute distress.    Appearance: He is well-developed.  HENT:     Head: Normocephalic and atraumatic.     Right Ear: Ear canal and external ear normal. There is impacted cerumen.     Left Ear: Ear canal and external ear normal. There is impacted cerumen.     Ears:     Comments: Bilateral cerumen impaction, partially successful removal attempted with plastic curette    Nose: Nose normal.     Mouth/Throat:     Pharynx: Uvula midline. No oropharyngeal exudate or posterior oropharyngeal erythema.  Eyes:     General: No scleral icterus.    Conjunctiva/sclera: Conjunctivae normal.     Pupils: Pupils are equal, round, and reactive to light.  Neck:     Musculoskeletal: Normal range of motion and neck supple.     Vascular: No carotid bruit.  Cardiovascular:     Rate and Rhythm: Normal rate and regular rhythm.     Pulses:          Radial pulses are 2+ on the right side and 2+ on the left side.     Heart sounds: Normal heart sounds. No murmur.  Pulmonary:     Effort: Pulmonary effort is normal. No respiratory distress.     Breath sounds: Normal breath sounds. No wheezing or rales.  Abdominal:     General: Bowel sounds are normal. There is no distension.     Palpations: Abdomen is soft. There is no mass.     Tenderness: There is no abdominal tenderness. There is no guarding or rebound.  Genitourinary:    Prostate: Enlarged (30gm). Not tender and no nodules present.     Rectum: Normal. No mass, tenderness, anal fissure, external hemorrhoid or internal hemorrhoid. Normal anal tone.  Musculoskeletal: Normal range of motion.     Comments: See HPI for foot exam if done  Lymphadenopathy:     Cervical: No cervical adenopathy.  Skin:    General: Skin  is warm and dry.     Findings: No rash.     Comments: Hyperpigmented macules on face - bilateral cheeks - with some scaling  Neurological:     Mental Status: He is alert and oriented to person, place, and time.     Comments: CN grossly intact, station and gait intact  Psychiatric:        Behavior: Behavior normal.  Thought Content: Thought content normal.        Judgment: Judgment normal.    Results for orders placed or performed in visit on 02/01/18  POCT glycosylated hemoglobin (Hb A1C)  Result Value Ref Range   Hemoglobin A1C 8.5 (A) 4.0 - 5.6 %   HbA1c POC (<> result, manual entry)     HbA1c, POC (prediabetic range)     HbA1c, POC (controlled diabetic range)        Assessment & Plan:   Problem List Items Addressed This Visit    Smoker    Has stopped vaping but back to 1ppd. Not yet ready to quit. Contemplative. Wants to quit with wife. Encouraged full cessation. Discussed lung cancer screening, he declines at this time due to finances.       Obesity, Class I, BMI 30-34.9    Encouraged healthy diet and lifestyle changes to affect sustainable weight loss.       HLD (hyperlipidemia)    Chronic on simvastatin QOD dosing. Update FLP. The 10-year ASCVD risk score Mikey Bussing DC Brooke Bonito., et al., 2013) is: 21.5%   Values used to calculate the score:     Age: 30 years     Sex: Male     Is Non-Hispanic African American: No     Diabetic: Yes     Tobacco smoker: Yes     Systolic Blood Pressure: 734 mmHg     Is BP treated: No     HDL Cholesterol: 56.7 mg/dL     Total Cholesterol: 187 mg/dL       Relevant Medications   simvastatin (ZOCOR) 20 MG tablet   Other Relevant Orders   Lipid panel   Basic metabolic panel   GERD (gastroesophageal reflux disease)    Continue daily PPI      Relevant Medications   omeprazole (PRILOSEC) 40 MG capsule   Diabetes mellitus type 2, uncontrolled, without complications (HCC) - Primary    Chronic, uncontrolled based on A1c. Reviewed options,  recommended once weekly GLP1 RA in addition to current regimen of metformin and amaryl. Provided with ozempic savings box and sent Rx to pharmacy for him to price out. RTC 4 mo f/u visit. Currently uses bayer brand glucometer.  I have requested latest eye exam.       Relevant Medications   glimepiride (AMARYL) 2 MG tablet   metFORMIN (GLUCOPHAGE) 1000 MG tablet   simvastatin (ZOCOR) 20 MG tablet   Semaglutide,0.25 or 0.5MG /DOS, (OZEMPIC, 0.25 OR 0.5 MG/DOSE,) 2 MG/1.5ML SOPN   Other Relevant Orders   POCT glycosylated hemoglobin (Hb A1C) (Completed)   Basic metabolic panel   Microalbumin / creatinine urine ratio   Bilateral hearing loss due to cerumen impaction    Partially successful cerumen removal with plastic curette, but large amt wax remains. rec continue home treatment. Consider irrigation next visit.       Benign prostatic hyperplasia    Noticing symptoms attributable to BPH including nocturia and dribbling. DRE with BPH. Check PSA today. Declines medication at this time.       Relevant Orders   PSA   AK (actinic keratosis)    Agrees to derm referral for presumed AKs of face.       Relevant Orders   Ambulatory referral to Dermatology    Other Visit Diagnoses    Special screening for malignant neoplasm of prostate       Relevant Orders   PSA       Meds ordered this encounter  Medications  .  glimepiride (AMARYL) 2 MG tablet    Sig: Take 1 tablet (2 mg total) by mouth daily.    Dispense:  90 tablet    Refill:  3  . metFORMIN (GLUCOPHAGE) 1000 MG tablet    Sig: Take 1 tablet (1,000 mg total) by mouth 2 (two) times daily with a meal.    Dispense:  180 tablet    Refill:  3  . omeprazole (PRILOSEC) 40 MG capsule    Sig: Take 1 capsule (40 mg total) by mouth daily.    Dispense:  90 capsule    Refill:  3  . simvastatin (ZOCOR) 20 MG tablet    Sig: Take 1 tablet (20 mg total) by mouth every other day.    Dispense:  45 tablet    Refill:  0  . Semaglutide,0.25 or  0.5MG /DOS, (OZEMPIC, 0.25 OR 0.5 MG/DOSE,) 2 MG/1.5ML SOPN    Sig: Inject 0.25 mg into the skin once a week for 30 days, THEN 0.5 mg once a week.    Dispense:  1 pen    Refill:  3   Orders Placed This Encounter  Procedures  . Lipid panel  . PSA  . Basic metabolic panel  . Microalbumin / creatinine urine ratio  . Ambulatory referral to Dermatology    Referral Priority:   Routine    Referral Type:   Consultation    Referral Reason:   Specialty Services Required    Requested Specialty:   Dermatology    Number of Visits Requested:   1  . POCT glycosylated hemoglobin (Hb A1C)   Patient Instructions  Labs today.  Hearing screen today.  Sign release for eye exam report up front.  Look at diabetes.org for more diabetes resources.  Foot exam today.  Price out ozempic once weekly shot. Let me know if unaffordable as we may need to increase amaryl medicine.  Return as needed or in 4 months for follow up visit.    Follow up plan: Return in about 4 months (around 06/03/2018) for follow up visit.  Ria Bush, MD

## 2018-01-31 NOTE — Assessment & Plan Note (Deleted)
Preventative protocols reviewed and updated unless pt declined. Discussed healthy diet and lifestyle.  

## 2018-02-01 ENCOUNTER — Ambulatory Visit: Payer: Self-pay | Admitting: Family Medicine

## 2018-02-01 ENCOUNTER — Encounter: Payer: Self-pay | Admitting: Family Medicine

## 2018-02-01 VITALS — BP 128/80 | HR 82 | Temp 97.8°F | Ht 66.0 in | Wt 198.5 lb

## 2018-02-01 DIAGNOSIS — IMO0001 Reserved for inherently not codable concepts without codable children: Secondary | ICD-10-CM

## 2018-02-01 DIAGNOSIS — H6123 Impacted cerumen, bilateral: Secondary | ICD-10-CM

## 2018-02-01 DIAGNOSIS — N401 Enlarged prostate with lower urinary tract symptoms: Secondary | ICD-10-CM

## 2018-02-01 DIAGNOSIS — L570 Actinic keratosis: Secondary | ICD-10-CM

## 2018-02-01 DIAGNOSIS — R351 Nocturia: Secondary | ICD-10-CM

## 2018-02-01 DIAGNOSIS — Z125 Encounter for screening for malignant neoplasm of prostate: Secondary | ICD-10-CM

## 2018-02-01 DIAGNOSIS — E669 Obesity, unspecified: Secondary | ICD-10-CM

## 2018-02-01 DIAGNOSIS — E1165 Type 2 diabetes mellitus with hyperglycemia: Secondary | ICD-10-CM

## 2018-02-01 DIAGNOSIS — F17200 Nicotine dependence, unspecified, uncomplicated: Secondary | ICD-10-CM

## 2018-02-01 DIAGNOSIS — E785 Hyperlipidemia, unspecified: Secondary | ICD-10-CM

## 2018-02-01 DIAGNOSIS — K219 Gastro-esophageal reflux disease without esophagitis: Secondary | ICD-10-CM

## 2018-02-01 LAB — POCT GLYCOSYLATED HEMOGLOBIN (HGB A1C): Hemoglobin A1C: 8.5 % — AB (ref 4.0–5.6)

## 2018-02-01 MED ORDER — OMEPRAZOLE 40 MG PO CPDR: 40.0000 mg | DELAYED_RELEASE_CAPSULE | Freq: Every day | ORAL | 3 refills | Status: DC

## 2018-02-01 MED ORDER — SEMAGLUTIDE(0.25 OR 0.5MG/DOS) 2 MG/1.5ML ~~LOC~~ SOPN: PEN_INJECTOR | SUBCUTANEOUS | 3 refills | Status: AC

## 2018-02-01 MED ORDER — GLIMEPIRIDE 2 MG PO TABS: 2.0000 mg | ORAL_TABLET | Freq: Every day | ORAL | 3 refills | Status: DC

## 2018-02-01 MED ORDER — METFORMIN HCL 1000 MG PO TABS: 1000.0000 mg | ORAL_TABLET | Freq: Two times a day (BID) | ORAL | 3 refills | Status: DC

## 2018-02-01 MED ORDER — SIMVASTATIN 20 MG PO TABS: 20.0000 mg | ORAL_TABLET | ORAL | 0 refills | Status: DC

## 2018-02-01 NOTE — Patient Instructions (Addendum)
Labs today.  Hearing screen today.  Sign release for eye exam report up front.  Look at diabetes.org for more diabetes resources.  Foot exam today.  Price out ozempic once weekly shot. Let me know if unaffordable as we may need to increase amaryl medicine.  Return as needed or in 4 months for follow up visit.

## 2018-02-02 ENCOUNTER — Telehealth: Payer: Self-pay

## 2018-02-02 ENCOUNTER — Encounter: Payer: Self-pay | Admitting: Family Medicine

## 2018-02-02 DIAGNOSIS — H6123 Impacted cerumen, bilateral: Secondary | ICD-10-CM | POA: Insufficient documentation

## 2018-02-02 DIAGNOSIS — K219 Gastro-esophageal reflux disease without esophagitis: Secondary | ICD-10-CM | POA: Insufficient documentation

## 2018-02-02 LAB — BASIC METABOLIC PANEL
BUN: 12 mg/dL (ref 6–23)
CO2: 29 mEq/L (ref 19–32)
Calcium: 9.6 mg/dL (ref 8.4–10.5)
Chloride: 102 mEq/L (ref 96–112)
Creatinine, Ser: 0.76 mg/dL (ref 0.40–1.50)
GFR: 111.15 mL/min (ref >60.00)
Glucose, Bld: 108 mg/dL — ABNORMAL HIGH (ref 70–99)
Potassium: 4.3 mEq/L (ref 3.5–5.1)
Sodium: 140 mEq/L (ref 135–145)

## 2018-02-02 LAB — LIPID PANEL
Cholesterol: 150 mg/dL (ref 0–200)
HDL: 44.7 mg/dL (ref >39.00)
LDL Cholesterol: 79 mg/dL (ref 0–99)
NonHDL: 105
Total CHOL/HDL Ratio: 3
Triglycerides: 130 mg/dL (ref 0.0–149.0)
VLDL: 26 mg/dL (ref 0.0–40.0)

## 2018-02-02 LAB — MICROALBUMIN / CREATININE URINE RATIO
Creatinine,U: 78.8 mg/dL
Microalb Creat Ratio: 2.1 mg/g (ref 0.0–30.0)
Microalb, Ur: 1.7 mg/dL (ref 0.0–1.9)

## 2018-02-02 LAB — PSA: PSA: 1.13 ng/mL (ref 0.10–4.00)

## 2018-02-02 NOTE — Assessment & Plan Note (Signed)
Continue daily PPI.  

## 2018-02-02 NOTE — Assessment & Plan Note (Addendum)
Chronic, uncontrolled based on A1c. Reviewed options, recommended once weekly GLP1 RA in addition to current regimen of metformin and amaryl. Provided with ozempic savings box and sent Rx to pharmacy for him to price out. RTC 4 mo f/u visit. Currently uses bayer brand glucometer.  I have requested latest eye exam.

## 2018-02-02 NOTE — Assessment & Plan Note (Signed)
Has stopped vaping but back to 1ppd. Not yet ready to quit. Contemplative. Wants to quit with wife. Encouraged full cessation. Discussed lung cancer screening, he declines at this time due to finances.

## 2018-02-02 NOTE — Assessment & Plan Note (Signed)
Noticing symptoms attributable to BPH including nocturia and dribbling. DRE with BPH. Check PSA today. Declines medication at this time.

## 2018-02-02 NOTE — Assessment & Plan Note (Signed)
Agrees to derm referral for presumed AKs of face.

## 2018-02-02 NOTE — Assessment & Plan Note (Signed)
Chronic on simvastatin QOD dosing. Update FLP. The 10-year ASCVD risk score Mikey Bussing DC Brooke Bonito., et al., 2013) is: 21.5%   Values used to calculate the score:     Age: 62 years     Sex: Male     Is Non-Hispanic African American: No     Diabetic: Yes     Tobacco smoker: Yes     Systolic Blood Pressure: 131 mmHg     Is BP treated: No     HDL Cholesterol: 56.7 mg/dL     Total Cholesterol: 187 mg/dL

## 2018-02-02 NOTE — Assessment & Plan Note (Signed)
Encouraged healthy diet and lifestyle changes to affect sustainable weight loss.  

## 2018-02-02 NOTE — Assessment & Plan Note (Signed)
Partially successful cerumen removal with plastic curette, but large amt wax remains. rec continue home treatment. Consider irrigation next visit.

## 2018-02-02 NOTE — Telephone Encounter (Addendum)
Attempted to contact pt.  No answer.  Vm box is full.   Need to relay results, message and instructions per Dr. Darnell Level.   Results Your kidneys and prostate are normal.  Your cholesterol levels are doing well on simvastatin - continue.

## 2018-02-04 ENCOUNTER — Telehealth: Payer: Self-pay

## 2018-02-04 MED ORDER — OMEPRAZOLE 40 MG PO CPDR: 40.0000 mg | DELAYED_RELEASE_CAPSULE | Freq: Every day | ORAL | 3 refills | Status: DC

## 2018-02-04 MED ORDER — SIMVASTATIN 20 MG PO TABS: 20.0000 mg | ORAL_TABLET | ORAL | 0 refills | Status: DC

## 2018-02-04 MED ORDER — GLIMEPIRIDE 2 MG PO TABS: 2.0000 mg | ORAL_TABLET | Freq: Every day | ORAL | 3 refills | Status: DC

## 2018-02-04 MED ORDER — METFORMIN HCL 1000 MG PO TABS: 1000.0000 mg | ORAL_TABLET | Freq: Two times a day (BID) | ORAL | 3 refills | Status: DC

## 2018-02-04 NOTE — Telephone Encounter (Signed)
Pt returning your call. Please call pt. °

## 2018-02-04 NOTE — Telephone Encounter (Signed)
Spoke with pt relaying results and instructions.  Pt verbalizes understanding.  Also, pt mentioned he checked on prices of Ozempic.  States he could not get any help with the cost because has no insurance.

## 2018-02-04 NOTE — Telephone Encounter (Signed)
Received fax from [facility] requesting additional info or clarification.  I spoke with [facility] and they stated they cannot locate pt in their system. Realized it was due to pt not having insurance.  E-scribed refills to pt's local pharmacy, [facility]. Notified pt.

## 2018-02-08 ENCOUNTER — Encounter: Payer: Self-pay | Admitting: Family Medicine

## 2018-02-16 ENCOUNTER — Other Ambulatory Visit: Payer: Self-pay | Admitting: Family Medicine

## 2018-02-16 MED ORDER — GLIMEPIRIDE 4 MG PO TABS: 4.0000 mg | ORAL_TABLET | Freq: Every day | ORAL | 3 refills | Status: DC

## 2018-05-10 ENCOUNTER — Other Ambulatory Visit: Payer: Self-pay | Admitting: Family Medicine

## 2018-11-23 ENCOUNTER — Other Ambulatory Visit: Payer: Self-pay | Admitting: Family Medicine

## 2018-11-23 NOTE — Telephone Encounter (Signed)
Sent to the pharmacy that patient needs an appointment.

## 2018-12-21 ENCOUNTER — Other Ambulatory Visit: Payer: Self-pay | Admitting: Family Medicine

## 2019-01-05 ENCOUNTER — Other Ambulatory Visit: Payer: Self-pay | Admitting: Family Medicine

## 2019-02-18 ENCOUNTER — Other Ambulatory Visit: Payer: Self-pay | Admitting: Family Medicine

## 2019-02-21 ENCOUNTER — Telehealth: Payer: Self-pay | Admitting: Family Medicine

## 2019-02-22 NOTE — Telephone Encounter (Signed)
E-scribed refill.  Pls schedule CPE and lab visits.

## 2019-02-23 NOTE — Telephone Encounter (Signed)
Noted  

## 2019-02-23 NOTE — Telephone Encounter (Signed)
Patient scheduled.

## 2019-04-01 ENCOUNTER — Other Ambulatory Visit: Payer: Self-pay | Admitting: Family Medicine

## 2019-04-17 ENCOUNTER — Other Ambulatory Visit: Payer: Self-pay | Admitting: Family Medicine

## 2019-04-17 DIAGNOSIS — IMO0002 Reserved for concepts with insufficient information to code with codable children: Secondary | ICD-10-CM

## 2019-04-17 DIAGNOSIS — N401 Enlarged prostate with lower urinary tract symptoms: Secondary | ICD-10-CM

## 2019-04-17 DIAGNOSIS — E785 Hyperlipidemia, unspecified: Secondary | ICD-10-CM

## 2019-04-17 DIAGNOSIS — E1165 Type 2 diabetes mellitus with hyperglycemia: Secondary | ICD-10-CM

## 2019-04-17 DIAGNOSIS — R351 Nocturia: Secondary | ICD-10-CM

## 2019-04-19 ENCOUNTER — Other Ambulatory Visit: Payer: Self-pay

## 2019-04-19 ENCOUNTER — Other Ambulatory Visit (INDEPENDENT_AMBULATORY_CARE_PROVIDER_SITE_OTHER): Payer: Self-pay

## 2019-04-19 DIAGNOSIS — E785 Hyperlipidemia, unspecified: Secondary | ICD-10-CM

## 2019-04-19 DIAGNOSIS — R351 Nocturia: Secondary | ICD-10-CM

## 2019-04-19 DIAGNOSIS — E118 Type 2 diabetes mellitus with unspecified complications: Secondary | ICD-10-CM

## 2019-04-19 DIAGNOSIS — E1165 Type 2 diabetes mellitus with hyperglycemia: Secondary | ICD-10-CM

## 2019-04-19 DIAGNOSIS — IMO0002 Reserved for concepts with insufficient information to code with codable children: Secondary | ICD-10-CM

## 2019-04-19 DIAGNOSIS — N401 Enlarged prostate with lower urinary tract symptoms: Secondary | ICD-10-CM

## 2019-04-19 LAB — LIPID PANEL
Cholesterol: 148 mg/dL (ref 0–200)
HDL: 45.5 mg/dL (ref >39.00)
LDL Cholesterol: 84 mg/dL (ref 0–99)
NonHDL: 102.87
Total CHOL/HDL Ratio: 3
Triglycerides: 95 mg/dL (ref 0.0–149.0)
VLDL: 19 mg/dL (ref 0.0–40.0)

## 2019-04-19 LAB — COMPREHENSIVE METABOLIC PANEL
ALT: 26 U/L (ref 0–53)
AST: 16 U/L (ref 0–37)
Albumin: 4.4 g/dL (ref 3.5–5.2)
Alkaline Phosphatase: 81 U/L (ref 39–117)
BUN: 13 mg/dL (ref 6–23)
CO2: 29 mEq/L (ref 19–32)
Calcium: 9.6 mg/dL (ref 8.4–10.5)
Chloride: 102 mEq/L (ref 96–112)
Creatinine, Ser: 0.84 mg/dL (ref 0.40–1.50)
GFR: 92.8 mL/min (ref >60.00)
Glucose, Bld: 255 mg/dL — ABNORMAL HIGH (ref 70–99)
Potassium: 4.8 mEq/L (ref 3.5–5.1)
Sodium: 137 mEq/L (ref 135–145)
Total Bilirubin: 0.5 mg/dL (ref 0.2–1.2)
Total Protein: 6.9 g/dL (ref 6.0–8.3)

## 2019-04-19 LAB — PSA: PSA: 0.77 ng/mL (ref 0.10–4.00)

## 2019-04-19 LAB — HEMOGLOBIN A1C: Hgb A1c MFr Bld: 9.9 % — ABNORMAL HIGH (ref 4.6–6.5)

## 2019-04-26 ENCOUNTER — Encounter: Payer: Self-pay | Admitting: Family Medicine

## 2019-04-26 ENCOUNTER — Ambulatory Visit (INDEPENDENT_AMBULATORY_CARE_PROVIDER_SITE_OTHER): Payer: Self-pay | Admitting: Family Medicine

## 2019-04-26 ENCOUNTER — Other Ambulatory Visit: Payer: Self-pay

## 2019-04-26 VITALS — BP 120/72 | HR 86 | Temp 97.6°F | Ht 66.0 in | Wt 197.1 lb

## 2019-04-26 DIAGNOSIS — N401 Enlarged prostate with lower urinary tract symptoms: Secondary | ICD-10-CM

## 2019-04-26 DIAGNOSIS — E1136 Type 2 diabetes mellitus with diabetic cataract: Secondary | ICD-10-CM

## 2019-04-26 DIAGNOSIS — F17200 Nicotine dependence, unspecified, uncomplicated: Secondary | ICD-10-CM

## 2019-04-26 DIAGNOSIS — R351 Nocturia: Secondary | ICD-10-CM

## 2019-04-26 DIAGNOSIS — IMO0002 Reserved for concepts with insufficient information to code with codable children: Secondary | ICD-10-CM

## 2019-04-26 DIAGNOSIS — E669 Obesity, unspecified: Secondary | ICD-10-CM

## 2019-04-26 DIAGNOSIS — Z0000 Encounter for general adult medical examination without abnormal findings: Secondary | ICD-10-CM

## 2019-04-26 DIAGNOSIS — K219 Gastro-esophageal reflux disease without esophagitis: Secondary | ICD-10-CM

## 2019-04-26 DIAGNOSIS — E1165 Type 2 diabetes mellitus with hyperglycemia: Secondary | ICD-10-CM

## 2019-04-26 DIAGNOSIS — E785 Hyperlipidemia, unspecified: Secondary | ICD-10-CM

## 2019-04-26 DIAGNOSIS — E1169 Type 2 diabetes mellitus with other specified complication: Secondary | ICD-10-CM

## 2019-04-26 DIAGNOSIS — E118 Type 2 diabetes mellitus with unspecified complications: Secondary | ICD-10-CM

## 2019-04-26 LAB — POC URINALSYSI DIPSTICK (AUTOMATED)
Bilirubin, UA: NEGATIVE
Blood, UA: NEGATIVE
Glucose, UA: POSITIVE — AB
Ketones, UA: NEGATIVE
Leukocytes, UA: NEGATIVE
Nitrite, UA: NEGATIVE
Protein, UA: NEGATIVE
Spec Grav, UA: 1.02 (ref 1.010–1.025)
Urobilinogen, UA: 0.2 E.U./dL
pH, UA: 6 (ref 5.0–8.0)

## 2019-04-26 MED ORDER — PIOGLITAZONE HCL 15 MG PO TABS: 15.0000 mg | ORAL_TABLET | Freq: Every day | ORAL | 3 refills | Status: DC

## 2019-04-26 MED ORDER — GLIMEPIRIDE 4 MG PO TABS: 4.0000 mg | ORAL_TABLET | Freq: Every day | ORAL | 3 refills | Status: DC

## 2019-04-26 MED ORDER — METFORMIN HCL 1000 MG PO TABS: ORAL_TABLET | ORAL | 3 refills | Status: DC

## 2019-04-26 MED ORDER — OMEPRAZOLE 40 MG PO CPDR: 40.0000 mg | DELAYED_RELEASE_CAPSULE | Freq: Every day | ORAL | 3 refills | Status: DC

## 2019-04-26 MED ORDER — SIMVASTATIN 20 MG PO TABS: 20.0000 mg | ORAL_TABLET | ORAL | 3 refills | Status: DC

## 2019-04-26 NOTE — Patient Instructions (Addendum)
Continue current medicines, add actos 15mg  daily. Return in 3 months for diabetes follow up visit.  Good to see you today Medicines refilled today.  I do recommend getting a Covid vaccine.  Urinalysis today.  Google ozempic or trulicity - look for patient assistance program offered by company  Health Maintenance, Male Adopting a healthy lifestyle and getting preventive care are important in promoting health and wellness. Ask your health care provider about:  The right schedule for you to have regular tests and exams.  Things you can do on your own to prevent diseases and keep yourself healthy. What should I know about diet, weight, and exercise? Eat a healthy diet   Eat a diet that includes plenty of vegetables, fruits, low-fat dairy products, and lean protein.  Do not eat a lot of foods that are high in solid fats, added sugars, or sodium. Maintain a healthy weight Body mass index (BMI) is a measurement that can be used to identify possible weight problems. It estimates body fat based on height and weight. Your health care provider can help determine your BMI and help you achieve or maintain a healthy weight. Get regular exercise Get regular exercise. This is one of the most important things you can do for your health. Most adults should:  Exercise for at least 150 minutes each week. The exercise should increase your heart rate and make you sweat (moderate-intensity exercise).  Do strengthening exercises at least twice a week. This is in addition to the moderate-intensity exercise.  Spend less time sitting. Even light physical activity can be beneficial. Watch cholesterol and blood lipids Have your blood tested for lipids and cholesterol at 62 years of age, then have this test every 5 years. You may need to have your cholesterol levels checked more often if:  Your lipid or cholesterol levels are high.  You are older than 62 years of age.  You are at high risk for heart  disease. What should I know about cancer screening? Many types of cancers can be detected early and may often be prevented. Depending on your health history and family history, you may need to have cancer screening at various ages. This may include screening for:  Colorectal cancer.  Prostate cancer.  Skin cancer.  Lung cancer. What should I know about heart disease, diabetes, and high blood pressure? Blood pressure and heart disease  High blood pressure causes heart disease and increases the risk of stroke. This is more likely to develop in people who have high blood pressure readings, are of African descent, or are overweight.  Talk with your health care provider about your target blood pressure readings.  Have your blood pressure checked: ? Every 3-5 years if you are 57-96 years of age. ? Every year if you are 65 years old or older.  If you are between the ages of 28 and 67 and are a current or former smoker, ask your health care provider if you should have a one-time screening for abdominal aortic aneurysm (AAA). Diabetes Have regular diabetes screenings. This checks your fasting blood sugar level. Have the screening done:  Once every three years after age 34 if you are at a normal weight and have a low risk for diabetes.  More often and at a younger age if you are overweight or have a high risk for diabetes. What should I know about preventing infection? Hepatitis B If you have a higher risk for hepatitis B, you should be screened for this virus. Talk  with your health care provider to find out if you are at risk for hepatitis B infection. Hepatitis C Blood testing is recommended for:  Everyone born from [birth year] through 1963.  Anyone with known risk factors for hepatitis C. Sexually transmitted infections (STIs)  You should be screened each year for STIs, including gonorrhea and chlamydia, if: ? You are sexually active and are younger than 62 years of age. ? You are older  than 62 years of age and your health care provider tells you that you are at risk for this type of infection. ? Your sexual activity has changed since you were last screened, and you are at increased risk for chlamydia or gonorrhea. Ask your health care provider if you are at risk.  Ask your health care provider about whether you are at high risk for HIV. Your health care provider may recommend a prescription medicine to help prevent HIV infection. If you choose to take medicine to prevent HIV, you should first get tested for HIV. You should then be tested every 3 months for as long as you are taking the medicine. Follow these instructions at home: Lifestyle  Do not use any products that contain nicotine or tobacco, such as cigarettes, e-cigarettes, and chewing tobacco. If you need help quitting, ask your health care provider.  Do not use street drugs.  Do not share needles.  Ask your health care provider for help if you need support or information about quitting drugs. Alcohol use  Do not drink alcohol if your health care provider tells you not to drink.  If you drink alcohol: ? Limit how much you have to 0-2 drinks a day. ? Be aware of how much alcohol is in your drink. In the [place] one drink equals one 12 oz bottle of beer (355 mL), one 5 oz glass of wine (148 mL), or one 1 oz glass of hard liquor (44 mL). General instructions  Schedule regular health, dental, and eye exams.  Stay current with your vaccines.  Tell your health care provider if: ? You often feel depressed. ? You have ever been abused or do not feel safe at home. Summary  Adopting a healthy lifestyle and getting preventive care are important in promoting health and wellness.  Follow your health care provider's instructions about healthy diet, exercising, and getting tested or screened for diseases.  Follow your health care provider's instructions on monitoring your cholesterol and blood pressure. This  information is not intended to replace advice given to you by your health care provider. Make sure you discuss any questions you have with your health care provider. Document Revised: 01/20/2018 Document Reviewed: 01/20/2018 Elsevier Patient Education  2020 [employer].

## 2019-04-26 NOTE — Assessment & Plan Note (Addendum)
Chronic, uncontrolled based on A1c.  Will add actos 15mg  daily.  Encouraged RTC 3 mo DM f/u visit.  I also asked him to look into patient assistance programs for ozempic or trulicity.

## 2019-04-26 NOTE — Assessment & Plan Note (Signed)
Preventative protocols reviewed and updated unless pt declined. Discussed healthy diet and lifestyle.  

## 2019-04-26 NOTE — Progress Notes (Signed)
This visit was conducted in person.  BP 120/72 (BP Location: Left Arm, Patient Position: Sitting, Cuff Size: Normal)   Pulse 86   Temp 97.6 F (36.4 C) (Temporal)   Ht 5\' 6"  (1.676 m)   Wt 197 lb 1 oz (89.4 kg)   SpO2 93%   BMI 31.81 kg/m    CC: CPE Subjective:    Patient ID: Logan Tanner, male    DOB: Jun 30, 1955, 64 y.o.   MRN: [id number]  HPI: Logan Tanner is a 64 y.o. male presenting on 04/26/2019 for Annual Exam   Self pay.   DM - sugars running high in the mornings 150-250, then better in the afternoons (120-180). Doesn't check every day. Taking metformin 100mg  bid and glimepiride 4mg  (he's been taking 2mg  - and takes this with supper). Trouble affording more expensive medications.   Preventative: COLONOSCOPY Date: 01/2012 2 diminutive polyps, mod diverticulosis, rec rpt 10 yrs Carlean Purl)  Prostate screening - continue screening with yearly PSA  Lung cancer screening - eligible, but desires to postpone for now Flu shot - does not receive  Pneumovax 02/2011  Tetanus - 02/2010 Covid vaccine - unsure yet  Seat belt use discussed Sunscreen use discussed. No changing moles on skin.  Smoking - 1 ppd. Wife also smokes.  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 Niel Hummer ([employer]) Activity: no regular exercise, stays active on the job. Encouraged increased walking  Diet: good water, fruits/vegetables daily      Relevant past medical, surgical, family and social history reviewed and updated as indicated. Interim medical history since our last visit reviewed. Allergies and medications reviewed and updated. Outpatient Medications Prior to Visit  Medication Sig Dispense Refill  . aspirin 81 MG tablet  Take 81 mg by mouth daily.      . Coenzyme Q10 (CO Q 10) 100 MG CAPS Take 1 capsule by mouth daily. 30 capsule   . glucose blood test strip 1 each by Other route 2 (two) times daily as needed for other. Please schedule an appointment with Dr, having fasting labs prior for any refills 50 each 0  . naproxen sodium (ANAPROX) 220 MG tablet Take 220 mg by mouth as needed.     Marland Kitchen glimepiride (AMARYL) 2 MG tablet TAKE 1 TABLET BY MOUTH ONCE DAILY 30 tablet 0  . metFORMIN (GLUCOPHAGE) 1000 MG tablet TAKE 1 TABLET BY MOUTH TWICE DAILY WITH A MEAL 180 tablet 0  . omeprazole (PRILOSEC) 40 MG capsule Take 1 capsule (40 mg total) by mouth daily. (Patient taking differently: Take 40 mg by mouth daily. As needed) 90 capsule 3  . simvastatin (ZOCOR) 20 MG tablet TAKE 1 TABLET BY MOUTH EVERY OTHER DAY 45 tablet 1  . glimepiride (AMARYL) 4 MG tablet Take 1 tablet (4 mg total) by mouth daily. (Patient not taking: Reported on 04/26/2019) 90 tablet 3   No facility-administered medications prior to visit.     Per HPI unless specifically indicated in ROS section below Review of Systems  Constitutional: Negative for activity change, appetite change, chills, fatigue, fever and unexpected weight change.  HENT: Negative for hearing loss.   Eyes: Negative for visual disturbance.  Respiratory: Positive for cough, shortness of breath (smoker) and wheezing (in smoker). Negative  for chest tightness.   Cardiovascular: Negative for chest pain, palpitations and leg swelling.  Gastrointestinal: Negative for abdominal distention, abdominal pain, blood in stool, constipation, diarrhea, nausea and vomiting.  Genitourinary: Negative for difficulty urinating and hematuria.  Musculoskeletal: Negative for arthralgias, myalgias and neck pain.  Skin: Negative for rash.  Neurological: Negative for dizziness, seizures, syncope and headaches.  Hematological: Negative for adenopathy. Does not bruise/bleed easily.  Psychiatric/Behavioral:  Negative for dysphoric mood. The patient is not nervous/anxious.    Objective:    BP 120/72 (BP Location: Left Arm, Patient Position: Sitting, Cuff Size: Normal)   Pulse 86   Temp 97.6 F (36.4 C) (Temporal)   Ht 5\' 6"  (1.676 m)   Wt 197 lb 1 oz (89.4 kg)   SpO2 93%   BMI 31.81 kg/m   Wt Readings from Last 3 Encounters:  04/26/19 197 lb 1 oz (89.4 kg)  02/01/18 198 lb 8 oz (90 kg)  10/30/16 198 lb 12 oz (90.2 kg)    Physical Exam Vitals and nursing note reviewed.  Constitutional:      General: He is not in acute distress.    Appearance: Normal appearance. He is well-developed. He is not ill-appearing.  HENT:     Head: Normocephalic and atraumatic.     Right Ear: Hearing, tympanic membrane, ear canal and external ear normal.     Left Ear: Hearing, tympanic membrane, ear canal and external ear normal.     Mouth/Throat:     Pharynx: Uvula midline.  Eyes:     General: No scleral icterus.    Extraocular Movements: Extraocular movements intact.     Conjunctiva/sclera: Conjunctivae normal.     Pupils: Pupils are equal, round, and reactive to light.     Comments: Cataracts present  Neck:     Vascular: No carotid bruit.  Cardiovascular:     Rate and Rhythm: Normal rate and regular rhythm.     Pulses: Normal pulses.          Radial pulses are 2+ on the right side and 2+ on the left side.     Heart sounds: Normal heart sounds. No murmur.  Pulmonary:     Effort: Pulmonary effort is normal. No respiratory distress.     Breath sounds: Normal breath sounds. No wheezing, rhonchi or rales.  Abdominal:     General: Abdomen is flat. Bowel sounds are normal. There is no distension.     Palpations: Abdomen is soft. There is no mass.     Tenderness: There is no abdominal tenderness. There is no guarding or rebound.     Hernia: No hernia is present.  Musculoskeletal:        General: Normal range of motion.     Cervical back: Normal range of motion and neck supple.     Right lower leg:  No edema.     Left lower leg: No edema.  Lymphadenopathy:     Cervical: No cervical adenopathy.  Skin:    General: Skin is warm and dry.     Findings: No rash.  Neurological:     General: No focal deficit present.     Mental Status: He is alert and oriented to person, place, and time.     Comments: CN grossly intact, station and gait intact  Psychiatric:        Mood and Affect: Mood normal.        Behavior: Behavior normal.        Thought Content: Thought content  normal.        Judgment: Judgment normal.       Results for orders placed or performed in visit on 04/26/19  POCT Urinalysis Dipstick (Automated)  Result Value Ref Range   Color, UA yellow    Clarity, UA clear    Glucose, UA Positive (A) Negative   Bilirubin, UA negative    Ketones, UA negative    Spec Grav, UA 1.020 1.010 - 1.025   Blood, UA negative    pH, UA 6.0 5.0 - 8.0   Protein, UA Negative Negative   Urobilinogen, UA 0.2 0.2 or 1.0 E.U./dL   Nitrite, UA negative    Leukocytes, UA Negative Negative   Assessment & Plan:  This visit occurred during the SARS-CoV-2 public health emergency.  Safety protocols were in place, including screening questions prior to the visit, additional usage of staff PPE, and extensive cleaning of exam room while observing appropriate contact time as indicated for disinfecting solutions.   Problem List Items Addressed This Visit    Smoker    Continue to encourage smoking cessation. Precontemplative.  Discussed lung cancer screening - he would like to postpone for now.       Obesity, Class I, BMI 30-34.9    Encouraged healthy diet and lifestyle changes to affect sustainable weight loss.       Hyperlipidemia associated with type 2 diabetes mellitus (HCC)    Chronic, stable. Continue simvastatin 20mg  QOD The 10-year ASCVD risk score Mikey Bussing DC Jr., et al., 2013) is: 19.6%   Values used to calculate the score:     Age: 89 years     Sex: Male     Is Non-Hispanic African  American: No     Diabetic: Yes     Tobacco smoker: Yes     Systolic Blood Pressure: 123456 mmHg     Is BP treated: No     HDL Cholesterol: 45.5 mg/dL     Total Cholesterol: 148 mg/dL       Relevant Medications   pioglitazone (ACTOS) 15 MG tablet   metFORMIN (GLUCOPHAGE) 1000 MG tablet   simvastatin (ZOCOR) 20 MG tablet   glimepiride (AMARYL) 4 MG tablet   Healthcare maintenance - Primary    Preventative protocols reviewed and updated unless pt declined. Discussed healthy diet and lifestyle.       GERD (gastroesophageal reflux disease)    Continues daily PPI.       Relevant Medications   omeprazole (PRILOSEC) 40 MG capsule   Diabetes mellitus type 2, uncontrolled, with complications (HCC)    Chronic, uncontrolled based on A1c.  Will add actos 15mg  daily.  Encouraged RTC 3 mo DM f/u visit.  I also asked him to look into patient assistance programs for ozempic or trulicity.       Relevant Medications   pioglitazone (ACTOS) 15 MG tablet   metFORMIN (GLUCOPHAGE) 1000 MG tablet   simvastatin (ZOCOR) 20 MG tablet   glimepiride (AMARYL) 4 MG tablet   Other Relevant Orders   POCT Urinalysis Dipstick (Automated) (Completed)   Cataract associated with type 2 diabetes mellitus (HCC)   Relevant Medications   pioglitazone (ACTOS) 15 MG tablet   metFORMIN (GLUCOPHAGE) 1000 MG tablet   simvastatin (ZOCOR) 20 MG tablet   glimepiride (AMARYL) 4 MG tablet   Benign prostatic hyperplasia    Continue yearly PSA           Meds ordered this encounter  Medications  . pioglitazone (ACTOS) 15 MG tablet  Sig: Take 1 tablet (15 mg total) by mouth daily.    Dispense:  90 tablet    Refill:  3  . metFORMIN (GLUCOPHAGE) 1000 MG tablet    Sig: TAKE 1 TABLET BY MOUTH TWICE DAILY WITH A MEAL    Dispense:  180 tablet    Refill:  3  . omeprazole (PRILOSEC) 40 MG capsule    Sig: Take 1 capsule (40 mg total) by mouth daily.    Dispense:  90 capsule    Refill:  3  . simvastatin (ZOCOR) 20 MG  tablet    Sig: Take 1 tablet (20 mg total) by mouth every other day.    Dispense:  45 tablet    Refill:  3  . glimepiride (AMARYL) 4 MG tablet    Sig: Take 1 tablet (4 mg total) by mouth daily.    Dispense:  90 tablet    Refill:  3   Orders Placed This Encounter  Procedures  . POCT Urinalysis Dipstick (Automated)   Patient instructions: Continue current medicines, add actos 15mg  daily. Return in 3 months for diabetes follow up visit.  Good to see you today Medicines refilled today.  I do recommend getting a Covid vaccine.  Urinalysis today.  Google ozempic or trulicity - look for patient assistance program offered by company  Follow up plan: Return in about 3 months (around 07/27/2019) for follow up visit.  Ria Bush, MD

## 2019-04-27 DIAGNOSIS — E1136 Type 2 diabetes mellitus with diabetic cataract: Secondary | ICD-10-CM | POA: Insufficient documentation

## 2019-04-27 NOTE — Assessment & Plan Note (Signed)
Encouraged healthy diet and lifestyle changes to affect sustainable weight loss.  

## 2019-04-27 NOTE — Assessment & Plan Note (Signed)
Continues daily PPI.  

## 2019-04-27 NOTE — Assessment & Plan Note (Signed)
Continue yearly PSA.  

## 2019-04-27 NOTE — Assessment & Plan Note (Addendum)
Continue to encourage smoking cessation. Precontemplative.  Discussed lung cancer screening - he would like to postpone for now.

## 2019-04-27 NOTE — Assessment & Plan Note (Signed)
Chronic, stable. Continue simvastatin 20mg  QOD The 10-year ASCVD risk score Mikey Bussing DC Jr., et al., 2013) is: 19.6%   Values used to calculate the score:     Age: 64 years     Sex: Male     Is Non-Hispanic African American: No     Diabetic: Yes     Tobacco smoker: Yes     Systolic Blood Pressure: 123456 mmHg     Is BP treated: No     HDL Cholesterol: 45.5 mg/dL     Total Cholesterol: 148 mg/dL

## 2019-07-29 ENCOUNTER — Ambulatory Visit: Payer: Self-pay | Admitting: Family Medicine

## 2019-07-29 ENCOUNTER — Other Ambulatory Visit: Payer: Self-pay

## 2019-07-29 ENCOUNTER — Encounter: Payer: Self-pay | Admitting: Family Medicine

## 2019-07-29 VITALS — BP 122/78 | HR 79 | Temp 96.8°F | Ht 66.0 in | Wt 200.9 lb

## 2019-07-29 DIAGNOSIS — E1165 Type 2 diabetes mellitus with hyperglycemia: Secondary | ICD-10-CM

## 2019-07-29 DIAGNOSIS — E118 Type 2 diabetes mellitus with unspecified complications: Secondary | ICD-10-CM

## 2019-07-29 DIAGNOSIS — IMO0002 Reserved for concepts with insufficient information to code with codable children: Secondary | ICD-10-CM

## 2019-07-29 LAB — POCT GLYCOSYLATED HEMOGLOBIN (HGB A1C): Hemoglobin A1C: 8.1 % — AB (ref 4.0–5.6)

## 2019-07-29 MED ORDER — PIOGLITAZONE HCL 30 MG PO TABS: 30.0000 mg | ORAL_TABLET | Freq: Every day | ORAL | 3 refills | Status: DC

## 2019-07-29 NOTE — Assessment & Plan Note (Addendum)
Improving control with addition of actos but still above goal. Will continue titration of actos to 30mg  daily. RTC 4 mo f/u visit.  In smoker, check UA next visit.

## 2019-07-29 NOTE — Patient Instructions (Addendum)
Continue current medicines.  We will request latest eye exam from Dr Purvis Sheffield office. Increase actos to 30mg  daily - new dose at pharmacy. Double up until you run out.  Return in 4 months for follow up visit diabetes  The 15-15 rule for low sugars: If sugar reading below 70, have 15 grams of carbohydrate to raise your blood sugar and check it after 15 minutes. If it's still below 70 mg/dL, have another serving. 15 grams of carbs may be: -Glucose tablets (see instructions) -Gel tube (see instructions) -4 ounces (1/2 cup) of juice or regular soda (not diet) -1 tablespoon of sugar, honey, or corn syrup -Hard candies, jellybeans or gumdrops--see food label for how many to consume  Repeat these steps until your blood sugar is at least 70 mg/dL. Once your blood sugar is back to normal, eat a meal or snack to make sure it doesn't lower again.

## 2019-07-29 NOTE — Progress Notes (Signed)
This visit was conducted in person.  BP 122/78   Pulse 79   Temp (!) 96.8 F (36 C) (Temporal)   Ht 5\' 6"  (1.676 m)   Wt 200 lb 14.4 oz (91.1 kg)   SpO2 98%   BMI 32.43 kg/m    CC: 3 mo f/u visit  Subjective:    Patient ID: Logan Tanner, male    DOB: 1956/01/08, 64 y.o.   MRN: [id number]  HPI: Logan Tanner is a 64 y.o. male presenting on 07/29/2019 for Diabetes (last A1C 04/19/19 and was 9.9)   DM - does regularly check sugars 1-2 times daily, last night 88, this morning 220. Compliant with antihyperglycemic regimen which includes: metformin 1000mg  bid, glimepiride 2mg  with lunch. Last visit we added actos 15mg  to regimen. Trouble affording more expensive medications. Denies low sugars. Does feel shaky when he drops sugar levels. Denies paresthesias. Last diabetic eye exam Dr Gloriann Loan 04/2019. Pneumovax: 2013. Prevnar: not due. Glucometer brand: unsure, generic brand. DSME: declines. He was not eligible for pharmaceutical company assistance program due to self-pay. Lab Results  Component Value Date   HGBA1C 8.1 (A) 07/29/2019   Diabetic Foot Exam - Simple   Simple Foot Form Diabetic Foot exam was performed with the following findings: Yes 07/29/2019  8:52 AM  Visual Inspection No deformities, no ulcerations, no other skin breakdown bilaterally: Yes Sensation Testing Intact to touch and monofilament testing bilaterally: Yes Pulse Check Posterior Tibialis and Dorsalis pulse intact bilaterally: Yes Comments    Lab Results  Component Value Date   MICROALBUR 1.7 02/01/2018         Relevant past medical, surgical, family and social history reviewed and updated as indicated. Interim medical history since our last visit reviewed. Allergies and medications reviewed and updated. Outpatient Medications Prior to Visit  Medication Sig Dispense Refill  . aspirin 81 MG tablet Take 81 mg by mouth daily.      . Coenzyme Q10 (CO Q 10) 100 MG CAPS Take 1 capsule by mouth daily. 30  capsule   . glimepiride (AMARYL) 4 MG tablet Take 1 tablet (4 mg total) by mouth daily. 90 tablet 3  . metFORMIN (GLUCOPHAGE) 1000 MG tablet TAKE 1 TABLET BY MOUTH TWICE DAILY WITH A MEAL 180 tablet 3  . naproxen sodium (ANAPROX) 220 MG tablet Take 220 mg by mouth as needed.     Marland Kitchen omeprazole (PRILOSEC) 40 MG capsule Take 1 capsule (40 mg total) by mouth daily. 90 capsule 3  . simvastatin (ZOCOR) 20 MG tablet Take 1 tablet (20 mg total) by mouth every other day. 45 tablet 3  . glucose blood test strip 1 each by Other route 2 (two) times daily as needed for other. Please schedule an appointment with Dr, having fasting labs prior for any refills 50 each 0  . pioglitazone (ACTOS) 15 MG tablet Take 1 tablet (15 mg total) by mouth daily. 90 tablet 3   No facility-administered medications prior to visit.     Per HPI unless specifically indicated in ROS section below Review of Systems Objective:  BP 122/78   Pulse 79   Temp (!) 96.8 F (36 C) (Temporal)   Ht 5\' 6"  (1.676 m)   Wt 200 lb 14.4 oz (91.1 kg)   SpO2 98%   BMI 32.43 kg/m   Wt Readings from Last 3 Encounters:  07/29/19 200 lb 14.4 oz (91.1 kg)  04/26/19 197 lb 1 oz (89.4 kg)  02/01/18 198 lb 8 oz (  90 kg)      Physical Exam Vitals and nursing note reviewed.  Constitutional:      General: He is not in acute distress.    Appearance: Normal appearance. He is well-developed. He is not ill-appearing.  HENT:     Head: Normocephalic and atraumatic.  Eyes:     General: No scleral icterus.    Extraocular Movements: Extraocular movements intact.     Conjunctiva/sclera: Conjunctivae normal.     Pupils: Pupils are equal, round, and reactive to light.  Cardiovascular:     Rate and Rhythm: Normal rate and regular rhythm.     Pulses: Normal pulses.     Heart sounds: Normal heart sounds. No murmur heard.   Pulmonary:     Effort: Pulmonary effort is normal. No respiratory distress.     Breath sounds: Normal breath sounds. No wheezing,  rhonchi or rales.  Musculoskeletal:     Cervical back: Normal range of motion and neck supple.     Right lower leg: No edema.     Left lower leg: No edema.     Comments: See HPI for foot exam if done  Lymphadenopathy:     Cervical: No cervical adenopathy.  Skin:    General: Skin is warm and dry.     Findings: No rash.  Neurological:     Mental Status: He is alert.  Psychiatric:        Mood and Affect: Mood normal.        Behavior: Behavior normal.       Results for orders placed or performed in visit on 07/29/19  POCT glycosylated hemoglobin (Hb A1C)  Result Value Ref Range   Hemoglobin A1C 8.1 (A) 4.0 - 5.6 %   HbA1c POC (<> result, manual entry)     HbA1c, POC (prediabetic range)     HbA1c, POC (controlled diabetic range)     Assessment & Plan:  This visit occurred during the SARS-CoV-2 public health emergency.  Safety protocols were in place, including screening questions prior to the visit, additional usage of staff PPE, and extensive cleaning of exam room while observing appropriate contact time as indicated for disinfecting solutions.   Problem List Items Addressed This Visit    Diabetes mellitus type 2, uncontrolled, with complications ([place]) - Primary    Improving control with addition of actos but still above goal. Will continue titration of actos to 30mg  daily. RTC 4 mo f/u visit.  In smoker, check UA next visit.       Relevant Medications   pioglitazone (ACTOS) 30 MG tablet   Other Relevant Orders   POCT glycosylated hemoglobin (Hb A1C) (Completed)       Meds ordered this encounter  Medications  . pioglitazone (ACTOS) 30 MG tablet    Sig: Take 1 tablet (30 mg total) by mouth daily.    Dispense:  90 tablet    Refill:  3    Note new sig   Orders Placed This Encounter  Procedures  . POCT glycosylated hemoglobin (Hb A1C)    Patient Instructions  Continue current medicines.  We will request latest eye exam from Dr Purvis Sheffield office. Increase actos to 30mg   daily - new dose at pharmacy. Double up until you run out.  Return in 4 months for follow up visit diabetes  The 15-15 rule for low sugars: If sugar reading below 70, have 15 grams of carbohydrate to raise your blood sugar and check it after 15 minutes. If it's still below  70 mg/dL, have another serving. 15 grams of carbs may be: -Glucose tablets (see instructions) -Gel tube (see instructions) -4 ounces (1/2 cup) of juice or regular soda (not diet) -1 tablespoon of sugar, honey, or corn syrup -Hard candies, jellybeans or gumdrops--see food label for how many to consume  Repeat these steps until your blood sugar is at least 70 mg/dL. Once your blood sugar is back to normal, eat a meal or snack to make sure it doesn't lower again.     Follow up plan: Return in about 4 months (around 11/28/2019) for follow up visit.  Ria Bush, MD

## 2019-08-02 ENCOUNTER — Telehealth: Payer: Self-pay

## 2019-08-02 NOTE — Telephone Encounter (Addendum)
Lvm at [facility] asking for a call back with a fax #.   Need to fax MR request form.  [Form is in basket on [employer].]

## 2019-08-03 NOTE — Telephone Encounter (Signed)
Faxed form to [facility] at [phone].

## 2019-08-03 NOTE — Telephone Encounter (Signed)
Lvm at [facility] asking for a call back with a fax #.   Need to fax MR request form.  [Form is in basket on [employer].]

## 2019-08-03 NOTE — Telephone Encounter (Signed)
Patient returned phone call. He states he does not have the fax number for [facility].

## 2019-08-03 NOTE — Telephone Encounter (Signed)
Noted  

## 2019-08-03 NOTE — Telephone Encounter (Signed)
Lvm asking pt to call back.  Need to see if he has a fax # for Dr. Purvis Sheffield office at [facility].

## 2019-10-14 NOTE — Telephone Encounter (Addendum)
Received faxed note from [facility] eye care - last DM eye exam was 01/2018.  Pt is overdue for eye exam plz encourage he call and schedule.

## 2019-10-14 NOTE — Telephone Encounter (Signed)
Spoke with pt relaying Dr. Synthia Innocent message.  Pt verbalizes understanding but states there should be one for last yr.  Says he goes every year.   Faxed request.

## 2019-11-28 ENCOUNTER — Other Ambulatory Visit: Payer: Self-pay

## 2019-11-28 ENCOUNTER — Encounter: Payer: Self-pay | Admitting: Family Medicine

## 2019-11-28 ENCOUNTER — Ambulatory Visit (INDEPENDENT_AMBULATORY_CARE_PROVIDER_SITE_OTHER): Payer: Self-pay | Admitting: Family Medicine

## 2019-11-28 VITALS — BP 122/64 | HR 92 | Temp 96.2°F | Ht 66.0 in | Wt 211.6 lb

## 2019-11-28 DIAGNOSIS — E669 Obesity, unspecified: Secondary | ICD-10-CM

## 2019-11-28 DIAGNOSIS — K219 Gastro-esophageal reflux disease without esophagitis: Secondary | ICD-10-CM

## 2019-11-28 DIAGNOSIS — E118 Type 2 diabetes mellitus with unspecified complications: Secondary | ICD-10-CM

## 2019-11-28 DIAGNOSIS — IMO0002 Reserved for concepts with insufficient information to code with codable children: Secondary | ICD-10-CM

## 2019-11-28 DIAGNOSIS — E1165 Type 2 diabetes mellitus with hyperglycemia: Secondary | ICD-10-CM

## 2019-11-28 DIAGNOSIS — F17200 Nicotine dependence, unspecified, uncomplicated: Secondary | ICD-10-CM

## 2019-11-28 LAB — POCT URINALYSIS DIPSTICK
Bilirubin, UA: NEGATIVE
Blood, UA: NEGATIVE
Glucose, UA: NEGATIVE
Ketones, UA: NEGATIVE
Leukocytes, UA: NEGATIVE
Nitrite, UA: NEGATIVE
Protein, UA: NEGATIVE
Spec Grav, UA: 1.025 (ref 1.010–1.025)
Urobilinogen, UA: 0.2 E.U./dL
pH, UA: 6 (ref 5.0–8.0)

## 2019-11-28 LAB — MICROALBUMIN / CREATININE URINE RATIO
Creatinine,U: 66.7 mg/dL
Microalb Creat Ratio: 4.3 mg/g (ref 0.0–30.0)
Microalb, Ur: 2.9 mg/dL — ABNORMAL HIGH (ref 0.0–1.9)

## 2019-11-28 LAB — POCT GLYCOSYLATED HEMOGLOBIN (HGB A1C): Hemoglobin A1C: 7.5 % — AB (ref 4.0–5.6)

## 2019-11-28 MED ORDER — ASPIRIN EC 81 MG PO TBEC: 81.0000 mg | DELAYED_RELEASE_TABLET | ORAL | Status: DC

## 2019-11-28 NOTE — Assessment & Plan Note (Signed)
Weight gain noted. Encouraged healthy diet and lifestyle choices to affect sustainable weight loss.  

## 2019-11-28 NOTE — Assessment & Plan Note (Signed)
Managed with PRN PPI - rare use.

## 2019-11-28 NOTE — Assessment & Plan Note (Addendum)
Update A1c today.  Check UA/microalb today.  Continue current regimen.  Encouraged working towards weight loss, discussed prepared meals in an effort to achieve better glycemic control.

## 2019-11-28 NOTE — Patient Instructions (Addendum)
Consider restarting aspirin 2-3 times a week.  Urine test today (urinalysis and urine microalbumin).  Try bringing prepared meals for lunch  Return as needed or in 5-6 months for physical.

## 2019-11-28 NOTE — Progress Notes (Signed)
This visit was conducted in person.  BP 122/64   Pulse 92   Temp (!) 96.2 F (35.7 C) (Temporal)   Ht 5\' 6"  (1.676 m)   Wt 211 lb 9 oz (96 kg)   SpO2 98%   BMI 34.15 kg/m    CC: 4 mo DM f/u visit  Subjective:    Patient ID: Logan Tanner, male    DOB: Nov 25, 1955, 64 y.o.   MRN: [id number]  HPI: Logan Tanner is a 64 y.o. male presenting on 11/28/2019 for Follow-up   Self pay.   DM - does regularly check sugars 1-2 times daily: 69-170. Compliant with antihyperglycemic regimen which includes: metformin 1000mg  bid, glimepiride 2mg  daily with lunch and actos 30mg  daily. Increased night time snacking noted. Occasional lows (64 was lowest). Denies paresthesias. Last diabetic eye exam 04/2019 per pt. Pneumovax: 2013. Prevnar: not due. Glucometer brand: generic brand. DSME: declines. Discussed fmhx heart attack and stroke and aspirin indication. Weight gain noted.  Lab Results  Component Value Date   HGBA1C 7.5 (A) 11/28/2019   Diabetic Foot Exam - Simple   No data filed     Lab Results  Component Value Date   MICROALBUR 1.7 02/01/2018         Relevant past medical, surgical, family and social history reviewed and updated as indicated. Interim medical history since our last visit reviewed. Allergies and medications reviewed and updated. Outpatient Medications Prior to Visit  Medication Sig Dispense Refill  . Coenzyme Q10 (CO Q 10) 100 MG CAPS Take 1 capsule by mouth daily. 30 capsule   . glimepiride (AMARYL) 4 MG tablet Take 1 tablet (4 mg total) by mouth daily. 90 tablet 3  . metFORMIN (GLUCOPHAGE) 1000 MG tablet TAKE 1 TABLET BY MOUTH TWICE DAILY WITH A MEAL 180 tablet 3  . naproxen sodium (ANAPROX) 220 MG tablet Take 220 mg by mouth as needed.     Marland Kitchen omeprazole (PRILOSEC) 40 MG capsule Take 1 capsule (40 mg total) by mouth daily as needed.    . pioglitazone (ACTOS) 30 MG tablet Take 1 tablet (30 mg total) by mouth daily. 90 tablet 3  . simvastatin (ZOCOR) 20 MG  tablet Take 1 tablet (20 mg total) by mouth every other day. 45 tablet 3  . aspirin 81 MG tablet Take 81 mg by mouth daily.      Marland Kitchen omeprazole (PRILOSEC) 40 MG capsule Take 1 capsule (40 mg total) by mouth daily. 90 capsule 3   No facility-administered medications prior to visit.     Per HPI unless specifically indicated in ROS section below Review of Systems Objective:  BP 122/64   Pulse 92   Temp (!) 96.2 F (35.7 C) (Temporal)   Ht 5\' 6"  (1.676 m)   Wt 211 lb 9 oz (96 kg)   SpO2 98%   BMI 34.15 kg/m   Wt Readings from Last 3 Encounters:  11/28/19 211 lb 9 oz (96 kg)  07/29/19 200 lb 14.4 oz (91.1 kg)  04/26/19 197 lb 1 oz (89.4 kg)      Physical Exam Vitals and nursing note reviewed.  Constitutional:      General: He is not in acute distress.    Appearance: Normal appearance. He is well-developed. He is obese. He is not ill-appearing.  HENT:     Head: Normocephalic and atraumatic.  Eyes:     General: No scleral icterus.    Extraocular Movements: Extraocular movements intact.     Conjunctiva/sclera:  Conjunctivae normal.     Pupils: Pupils are equal, round, and reactive to light.     Comments: Cataracts present  Cardiovascular:     Rate and Rhythm: Normal rate and regular rhythm.     Pulses: Normal pulses.     Heart sounds: Normal heart sounds. No murmur heard.   Pulmonary:     Effort: Pulmonary effort is normal. No respiratory distress.     Breath sounds: Normal breath sounds. No wheezing, rhonchi or rales.  Musculoskeletal:     Cervical back: Normal range of motion and neck supple.     Right lower leg: No edema.     Left lower leg: No edema.     Comments: See HPI for foot exam if done  Lymphadenopathy:     Cervical: No cervical adenopathy.  Skin:    General: Skin is warm and dry.     Findings: No rash.  Neurological:     Mental Status: He is alert.  Psychiatric:        Mood and Affect: Mood normal.        Behavior: Behavior normal.       Results for  orders placed or performed in visit on 11/28/19  POCT glycosylated hemoglobin (Hb A1C)  Result Value Ref Range   Hemoglobin A1C 7.5 (A) 4.0 - 5.6 %   HbA1c POC (<> result, manual entry)     HbA1c, POC (prediabetic range)     HbA1c, POC (controlled diabetic range)    POCT Urinalysis Dipstick  Result Value Ref Range   Color, UA YELLOW    Clarity, UA CLEAR    Glucose, UA Negative Negative   Bilirubin, UA NEGATIVE    Ketones, UA NEGATIVE    Spec Grav, UA 1.025 1.010 - 1.025   Blood, UA N    pH, UA 6.0 5.0 - 8.0   Protein, UA Negative Negative   Urobilinogen, UA 0.2 0.2 or 1.0 E.U./dL   Nitrite, UA NEGATIVE    Leukocytes, UA Negative Negative   Appearance     Odor     Assessment & Plan:  This visit occurred during the SARS-CoV-2 public health emergency.  Safety protocols were in place, including screening questions prior to the visit, additional usage of staff PPE, and extensive cleaning of exam room while observing appropriate contact time as indicated for disinfecting solutions.   Problem List Items Addressed This Visit    Smoker   Relevant Orders   POCT Urinalysis Dipstick (Completed)   Obesity, Class I, BMI 30-34.9    Weight gain noted. Encouraged healthy diet and lifestyle choices to affect sustainable weight loss.       GERD (gastroesophageal reflux disease)    Managed with PRN PPI - rare use.       Relevant Medications   omeprazole (PRILOSEC) 40 MG capsule   Diabetes mellitus type 2, uncontrolled, with complications ([place]) - Primary    Update A1c today.  Check UA/microalb today.  Continue current regimen.  Encouraged working towards weight loss, discussed prepared meals in an effort to achieve better glycemic control.       Relevant Medications   aspirin EC 81 MG tablet   Other Relevant Orders   POCT glycosylated hemoglobin (Hb A1C) (Completed)   Microalbumin / creatinine urine ratio   POCT Urinalysis Dipstick (Completed)       Meds ordered this encounter    Medications  . aspirin EC 81 MG tablet    Sig: Take 1 tablet (81 mg  total) by mouth every Monday, Wednesday, and Friday. Swallow whole.   Orders Placed This Encounter  Procedures  . Microalbumin / creatinine urine ratio  . POCT glycosylated hemoglobin (Hb A1C)  . POCT Urinalysis Dipstick    Follow up plan: Return in about 5 months (around 04/27/2020) for annual exam, prior fasting for blood work.  Ria Bush, MD

## 2020-01-18 ENCOUNTER — Other Ambulatory Visit: Payer: Self-pay | Admitting: Family Medicine

## 2020-02-11 DIAGNOSIS — U071 COVID-19: Secondary | ICD-10-CM

## 2020-02-11 HISTORY — DX: COVID-19: U07.1

## 2020-03-02 ENCOUNTER — Other Ambulatory Visit: Payer: Self-pay

## 2020-03-02 ENCOUNTER — Encounter: Payer: Self-pay | Admitting: Family Medicine

## 2020-03-02 ENCOUNTER — Telehealth (INDEPENDENT_AMBULATORY_CARE_PROVIDER_SITE_OTHER): Payer: HRSA Program | Admitting: Family Medicine

## 2020-03-02 DIAGNOSIS — U071 COVID-19: Secondary | ICD-10-CM | POA: Diagnosis not present

## 2020-03-02 DIAGNOSIS — E118 Type 2 diabetes mellitus with unspecified complications: Secondary | ICD-10-CM

## 2020-03-02 DIAGNOSIS — E1165 Type 2 diabetes mellitus with hyperglycemia: Secondary | ICD-10-CM | POA: Diagnosis not present

## 2020-03-02 DIAGNOSIS — IMO0002 Reserved for concepts with insufficient information to code with codable children: Secondary | ICD-10-CM

## 2020-03-02 NOTE — Progress Notes (Signed)
° °  CASSELL VOORHIES - 65 y.o. male   MRN [id number]   Date of Birth: [birth year]  PCP: Ria Bush, MD  This service was provided via telemedicine. Phone Visit performed on 03/02/2020    Rationale for phone visit along with limitations reviewed. I discussed the limitations, risks, security and privacy concerns of performing a phone visit and the availability of in person appointments. I also discussed with the patient that there may be a patient responsible charge related to this service. Patient consented to telephone encounter.    Location of patient: home Location of provider: in office, [place] @ [facility] Name of referring provider: N/A   Names of persons and role in encounter: Provider: Ria Bush, MD  Patient: Logan Tanner  Other: N/A   Time on call: 10:57am - 11:15am   Subjective: Chief Complaint  Patient presents with   Cough   Positive Covid Results     HPI:   First day of symptoms 02/19/2020.  Started with cough, head congestion, rhinorrhea - bad cold symptoms. Did feel better 2 days later. Some chest congestion, hoarseness.  Treated with OTC sinus relief medicine (afrin).  Wed 02/22/2020 COVID test returned positive.  Retested 02/29/2020 COVID test again returned positive again.  Home test afterwards returned negative. Wife tested negative every time she was tested.   Never fever. No dyspnea, good O2 sats at home (>95%), fatigue, no abd pain, nausea, diarrhea. No HA, ST.  Residual lingering cough.   Did receive J&J vaccine 08/2019 and booster.   btw - notes drops in sugars to 70s mid day. He takes glimepiride 4mg  with supper.  Lab Results  Component Value Date   HGBA1C 7.5 (A) 11/28/2019      Objective/Observations:  No physical exam or vital signs collected unless specifically identified below.   Pulse 96    SpO2 95%    Respiratory status: speaks in complete sentences without evident shortness of breath.    Assessment/Plan:  COVID-19 virus infection Symptoms started 02/19/2020. Outside of window to consider further treatment and symptoms have largely improved. Interestingly wife never tested positive although she had similar symptoms. Reviewed further supportive care at home.  Safe to return to work at this time. Letter provided - he will come pick it up this afternoon.  He did receive J&J vaccine and booster.   Diabetes mellitus type 2, uncontrolled, with complications ([place]) Now with some low sugars.  rec cut amaryl in half (drop to 2mg  once daily).  Will reassess at f/u visit next month.    I discussed the assessment and treatment plan with the patient. The patient was provided an opportunity to ask questions and all were answered. The patient agreed with the plan and demonstrated an understanding of the instructions.  Lab Orders  No laboratory test(s) ordered today    No orders of the defined types were placed in this encounter.   The patient was advised to call back or seek an in-person evaluation if the symptoms worsen or if the condition fails to improve as anticipated.  Ria Bush, MD

## 2020-03-02 NOTE — Assessment & Plan Note (Signed)
Symptoms started 02/19/2020. Outside of window to consider further treatment and symptoms have largely improved. Interestingly wife never tested positive although she had similar symptoms. Reviewed further supportive care at home.  Safe to return to work at this time. Letter provided - he will come pick it up this afternoon.  He did receive J&J vaccine and booster.

## 2020-03-02 NOTE — Assessment & Plan Note (Signed)
Now with some low sugars.  rec cut amaryl in half (drop to 2mg  once daily).  Will reassess at f/u visit next month.

## 2020-04-06 ENCOUNTER — Encounter: Payer: Self-pay | Admitting: Family Medicine

## 2020-04-06 ENCOUNTER — Other Ambulatory Visit: Payer: Self-pay

## 2020-04-06 ENCOUNTER — Ambulatory Visit (INDEPENDENT_AMBULATORY_CARE_PROVIDER_SITE_OTHER): Payer: Self-pay | Admitting: Family Medicine

## 2020-04-06 VITALS — BP 140/70 | HR 84 | Temp 97.9°F | Ht 66.0 in | Wt 213.1 lb

## 2020-04-06 DIAGNOSIS — IMO0002 Reserved for concepts with insufficient information to code with codable children: Secondary | ICD-10-CM

## 2020-04-06 DIAGNOSIS — E669 Obesity, unspecified: Secondary | ICD-10-CM

## 2020-04-06 DIAGNOSIS — E118 Type 2 diabetes mellitus with unspecified complications: Secondary | ICD-10-CM

## 2020-04-06 DIAGNOSIS — E1165 Type 2 diabetes mellitus with hyperglycemia: Secondary | ICD-10-CM

## 2020-04-06 DIAGNOSIS — R0989 Other specified symptoms and signs involving the circulatory and respiratory systems: Secondary | ICD-10-CM

## 2020-04-06 LAB — POCT GLYCOSYLATED HEMOGLOBIN (HGB A1C): Hemoglobin A1C: 7.5 % — AB (ref 4.0–5.6)

## 2020-04-06 MED ORDER — METFORMIN HCL 1000 MG PO TABS: ORAL_TABLET | ORAL | 0 refills | Status: DC

## 2020-04-06 MED ORDER — GLIMEPIRIDE 4 MG PO TABS: 4.0000 mg | ORAL_TABLET | Freq: Every day | ORAL | 0 refills | Status: DC

## 2020-04-06 MED ORDER — PIOGLITAZONE HCL 30 MG PO TABS: 30.0000 mg | ORAL_TABLET | Freq: Every day | ORAL | 0 refills | Status: DC

## 2020-04-06 MED ORDER — SIMVASTATIN 20 MG PO TABS: 20.0000 mg | ORAL_TABLET | ORAL | 0 refills | Status: DC

## 2020-04-06 NOTE — Patient Instructions (Signed)
A1c was 7.5%. goal for you <7%.  Continue current medicines, work on weight loss and diabetic diet.  I will check on financial assistance for newer diabetes medicines.  Return in 3 months for physical.

## 2020-04-06 NOTE — Assessment & Plan Note (Signed)
Discussed weight gain noted.  

## 2020-04-06 NOTE — Progress Notes (Signed)
Patient ID: Logan Tanner, male    DOB: 13-Sep-1955, 65 y.o.   MRN: [id number]  This visit was conducted in person.  BP 140/70   Pulse 84   Temp 97.9 F (36.6 C) (Temporal)   Ht 5\' 6"  (1.676 m)   Wt 213 lb 1 oz (96.6 kg)   SpO2 93%   BMI 34.39 kg/m    CC: 3 mo f/u visit  Subjective:   HPI: Logan Tanner is a 65 y.o. male presenting on 04/06/2020 for Follow-up (Here for 3 mo f/u.)   Mild COVID infection 02/2020. Did fully recover.  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   DM - does regularly check sugars 130-190 fasting, better later in the day 98-120s. Compliant with antihyperglycemic regimen which includes: metformin 1000mg  bid, glimepiride 2mg  with lunch, and actos 30mg  daily. Denies low sugars or hypoglycemic symptoms. Denies paresthesias. Last diabetic eye exam 04/2019. Pneumovax: 2013. Prevnar: not due. Glucometer brand: generic brand. DSME: declines.  Lab Results  Component Value Date   HGBA1C 7.5 (A) 04/06/2020   Diabetic Foot Exam - Simple   Simple Foot Form Diabetic Foot exam was performed with the following findings: Yes 04/06/2020  8:38 AM  Visual Inspection No deformities, no ulcerations, no other skin breakdown bilaterally: Yes Sensation Testing Intact to touch and monofilament testing bilaterally: Yes Pulse Check See comments: Yes Comments Diminished pulses bilaterally    Lab Results  Component Value Date   MICROALBUR 2.9 (H) 11/28/2019         Relevant past medical, surgical, family and social history reviewed and updated as indicated. Interim medical history since our last visit reviewed. Allergies and medications reviewed and updated. Outpatient Medications Prior to Visit  Medication Sig Dispense Refill  . aspirin EC 81 MG tablet Take 1 tablet (81 mg total) by mouth every Monday, Wednesday, and Friday. Swallow whole.    . Coenzyme Q10 (CO Q 10) 100 MG  CAPS Take 1 capsule by mouth daily. 30 capsule   . naproxen sodium (ANAPROX) 220 MG tablet Take 220 mg by mouth as needed.    Marland Kitchen omeprazole (PRILOSEC) 40 MG capsule Take 1 capsule (40 mg total) by mouth daily as needed.    Marland Kitchen glimepiride (AMARYL) 4 MG tablet TAKE 1 TABLET BY MOUTH ONCE DAILY 90 tablet 0  . metFORMIN (GLUCOPHAGE) 1000 MG tablet TAKE 1 TABLET BY MOUTH TWICE DAILY WITH A MEAL 180 tablet 3  . pioglitazone (ACTOS) 30 MG tablet Take 1 tablet (30 mg total) by mouth daily. 90 tablet 3  . simvastatin (ZOCOR) 20 MG tablet Take 1 tablet (20 mg total) by mouth every other day. 45 tablet 3   No facility-administered medications prior to visit.     Per HPI unless specifically indicated in ROS section below Review of Systems Objective:  BP 140/70   Pulse 84   Temp 97.9 F (36.6 C) (Temporal)   Ht 5\' 6"  (1.676 m)   Wt 213 lb 1 oz (96.6 kg)   SpO2 93%   BMI 34.39 kg/m   Wt Readings from Last 3 Encounters:  04/06/20 213 lb 1 oz (96.6 kg)  11/28/19 211 lb 9 oz (96 kg)  07/29/19 200 lb 14.4 oz (91.1 kg)      Physical Exam Vitals and nursing note reviewed.  Constitutional:      General: He is not in acute distress.  Appearance: He is well-developed and well-nourished.  HENT:     Head: Normocephalic and atraumatic.     Right Ear: External ear normal.     Left Ear: External ear normal.     Nose: Nose normal.     Mouth/Throat:     Mouth: Oropharynx is clear and moist.     Pharynx: No oropharyngeal exudate.  Eyes:     General: No scleral icterus.    Extraocular Movements: EOM normal.     Conjunctiva/sclera: Conjunctivae normal.     Pupils: Pupils are equal, round, and reactive to light.  Cardiovascular:     Rate and Rhythm: Normal rate and regular rhythm.     Pulses: Intact distal pulses.     Heart sounds: Normal heart sounds. No murmur heard.   Pulmonary:     Effort: Pulmonary effort is normal. No respiratory distress.     Breath sounds: Normal breath sounds. No  wheezing or rales.  Musculoskeletal:        General: No edema.     Cervical back: Normal range of motion and neck supple.     Comments: See HPI for foot exam if done  Lymphadenopathy:     Cervical: No cervical adenopathy.  Skin:    General: Skin is warm and dry.     Findings: No rash.  Psychiatric:        Mood and Affect: Mood and affect normal.       Results for orders placed or performed in visit on 04/06/20  POCT glycosylated hemoglobin (Hb A1C)  Result Value Ref Range   Hemoglobin A1C 7.5 (A) 4.0 - 5.6 %   HbA1c POC (<> result, manual entry)     HbA1c, POC (prediabetic range)     HbA1c, POC (controlled diabetic range)    No results found for: VITAMINB12   Lab Results  Component Value Date   TSH 1.56 07/18/2008    Assessment & Plan:  This visit occurred during the SARS-CoV-2 public health emergency.  Safety protocols were in place, including screening questions prior to the visit, additional usage of staff PPE, and extensive cleaning of exam room while observing appropriate contact time as indicated for disinfecting solutions.   Problem List Items Addressed This Visit    Obesity, Class I, BMI 30-34.9    Discussed weight gain noted.       Diminished pulses in lower extremity    Diminished pulses on foot exam today. Denies claudication. Consider arterial evaluation (ABIs) if claudication symptoms develop or in future.       Diabetes mellitus type 2, uncontrolled, with complications ([facility]) - Primary    A1c stable, continue current regimen.  Encouraged renewed efforts towards weight loss and diabetic diet.  Difficulty affording newer diabetes meds - I will check with my pharmacist to see if there's any other available financial assistance in self-pay patient.       Relevant Medications   glimepiride (AMARYL) 4 MG tablet   metFORMIN (GLUCOPHAGE) 1000 MG tablet   pioglitazone (ACTOS) 30 MG tablet   simvastatin (ZOCOR) 20 MG tablet   Other Relevant Orders   POCT  glycosylated hemoglobin (Hb A1C) (Completed)       Meds ordered this encounter  Medications  . glimepiride (AMARYL) 4 MG tablet    Sig: Take 1 tablet (4 mg total) by mouth daily.    Dispense:  90 tablet    Refill:  0  . metFORMIN (GLUCOPHAGE) 1000 MG tablet    Sig: TAKE  1 TABLET BY MOUTH TWICE DAILY WITH A MEAL    Dispense:  180 tablet    Refill:  0  . pioglitazone (ACTOS) 30 MG tablet    Sig: Take 1 tablet (30 mg total) by mouth daily.    Dispense:  90 tablet    Refill:  0    Note new sig  . simvastatin (ZOCOR) 20 MG tablet    Sig: Take 1 tablet (20 mg total) by mouth every other day.    Dispense:  45 tablet    Refill:  0   Orders Placed This Encounter  Procedures  . POCT glycosylated hemoglobin (Hb A1C)    Patient Instructions  A1c was 7.5%. goal for you <7%.  Continue current medicines, work on weight loss and diabetic diet.  I will check on financial assistance for newer diabetes medicines.  Return in 3 months for physical.   Follow up plan: Return in about 3 months (around 07/04/2020) for annual exam, prior fasting for blood work.  Ria Bush, MD

## 2020-04-06 NOTE — Assessment & Plan Note (Signed)
Diminished pulses on foot exam today. Denies claudication. Consider arterial evaluation (ABIs) if claudication symptoms develop or in future.

## 2020-04-06 NOTE — Assessment & Plan Note (Addendum)
A1c stable, continue current regimen.  Encouraged renewed efforts towards weight loss and diabetic diet.  Difficulty affording newer diabetes meds - I will check with my pharmacist to see if there's any other available financial assistance in self-pay patient.

## 2020-04-26 ENCOUNTER — Other Ambulatory Visit: Payer: Self-pay | Admitting: Family Medicine

## 2020-04-26 DIAGNOSIS — IMO0002 Reserved for concepts with insufficient information to code with codable children: Secondary | ICD-10-CM

## 2020-04-26 DIAGNOSIS — E785 Hyperlipidemia, unspecified: Secondary | ICD-10-CM

## 2020-04-26 DIAGNOSIS — E1165 Type 2 diabetes mellitus with hyperglycemia: Secondary | ICD-10-CM

## 2020-04-26 DIAGNOSIS — E1169 Type 2 diabetes mellitus with other specified complication: Secondary | ICD-10-CM

## 2020-04-26 DIAGNOSIS — N401 Enlarged prostate with lower urinary tract symptoms: Secondary | ICD-10-CM

## 2020-04-26 DIAGNOSIS — R351 Nocturia: Secondary | ICD-10-CM

## 2020-04-27 ENCOUNTER — Other Ambulatory Visit (INDEPENDENT_AMBULATORY_CARE_PROVIDER_SITE_OTHER): Payer: Self-pay

## 2020-04-27 ENCOUNTER — Other Ambulatory Visit: Payer: Self-pay

## 2020-04-27 DIAGNOSIS — E785 Hyperlipidemia, unspecified: Secondary | ICD-10-CM

## 2020-04-27 DIAGNOSIS — E118 Type 2 diabetes mellitus with unspecified complications: Secondary | ICD-10-CM

## 2020-04-27 DIAGNOSIS — E1165 Type 2 diabetes mellitus with hyperglycemia: Secondary | ICD-10-CM

## 2020-04-27 DIAGNOSIS — R351 Nocturia: Secondary | ICD-10-CM

## 2020-04-27 DIAGNOSIS — IMO0002 Reserved for concepts with insufficient information to code with codable children: Secondary | ICD-10-CM

## 2020-04-27 DIAGNOSIS — N401 Enlarged prostate with lower urinary tract symptoms: Secondary | ICD-10-CM

## 2020-04-27 DIAGNOSIS — E1169 Type 2 diabetes mellitus with other specified complication: Secondary | ICD-10-CM

## 2020-04-27 LAB — COMPREHENSIVE METABOLIC PANEL
ALT: 22 U/L (ref 0–53)
AST: 15 U/L (ref 0–37)
Albumin: 4.3 g/dL (ref 3.5–5.2)
Alkaline Phosphatase: 60 U/L (ref 39–117)
BUN: 21 mg/dL (ref 6–23)
CO2: 28 mEq/L (ref 19–32)
Calcium: 9.5 mg/dL (ref 8.4–10.5)
Chloride: 104 mEq/L (ref 96–112)
Creatinine, Ser: 0.78 mg/dL (ref 0.40–1.50)
GFR: 95.69 mL/min (ref >60.00)
Glucose, Bld: 189 mg/dL — ABNORMAL HIGH (ref 70–99)
Potassium: 4.9 mEq/L (ref 3.5–5.1)
Sodium: 141 mEq/L (ref 135–145)
Total Bilirubin: 0.4 mg/dL (ref 0.2–1.2)
Total Protein: 6.7 g/dL (ref 6.0–8.3)

## 2020-04-27 LAB — LIPID PANEL
Cholesterol: 146 mg/dL (ref 0–200)
HDL: 46.2 mg/dL (ref >39.00)
LDL Cholesterol: 84 mg/dL (ref 0–99)
NonHDL: 99.86
Total CHOL/HDL Ratio: 3
Triglycerides: 79 mg/dL (ref 0.0–149.0)
VLDL: 15.8 mg/dL (ref 0.0–40.0)

## 2020-04-27 LAB — PSA: PSA: 1.5 ng/mL (ref 0.10–4.00)

## 2020-05-02 LAB — FRUCTOSAMINE: Fructosamine: 297 umol/L — ABNORMAL HIGH (ref 205–285)

## 2020-05-04 ENCOUNTER — Encounter: Payer: Self-pay | Admitting: Family Medicine

## 2020-06-25 ENCOUNTER — Encounter: Payer: Self-pay | Admitting: Family Medicine

## 2020-06-25 ENCOUNTER — Other Ambulatory Visit: Payer: Self-pay

## 2020-06-25 ENCOUNTER — Ambulatory Visit (INDEPENDENT_AMBULATORY_CARE_PROVIDER_SITE_OTHER): Payer: Self-pay | Admitting: Family Medicine

## 2020-06-25 VITALS — BP 138/60 | HR 89 | Temp 97.8°F | Ht 66.0 in | Wt 204.4 lb

## 2020-06-25 DIAGNOSIS — R0989 Other specified symptoms and signs involving the circulatory and respiratory systems: Secondary | ICD-10-CM

## 2020-06-25 DIAGNOSIS — M545 Low back pain, unspecified: Secondary | ICD-10-CM

## 2020-06-25 DIAGNOSIS — E1165 Type 2 diabetes mellitus with hyperglycemia: Secondary | ICD-10-CM

## 2020-06-25 DIAGNOSIS — F17200 Nicotine dependence, unspecified, uncomplicated: Secondary | ICD-10-CM

## 2020-06-25 DIAGNOSIS — N401 Enlarged prostate with lower urinary tract symptoms: Secondary | ICD-10-CM

## 2020-06-25 DIAGNOSIS — R5382 Chronic fatigue, unspecified: Secondary | ICD-10-CM

## 2020-06-25 DIAGNOSIS — E66811 Obesity, class 1: Secondary | ICD-10-CM

## 2020-06-25 DIAGNOSIS — IMO0002 Reserved for concepts with insufficient information to code with codable children: Secondary | ICD-10-CM

## 2020-06-25 DIAGNOSIS — R351 Nocturia: Secondary | ICD-10-CM

## 2020-06-25 DIAGNOSIS — E669 Obesity, unspecified: Secondary | ICD-10-CM

## 2020-06-25 DIAGNOSIS — Z0000 Encounter for general adult medical examination without abnormal findings: Secondary | ICD-10-CM

## 2020-06-25 DIAGNOSIS — E785 Hyperlipidemia, unspecified: Secondary | ICD-10-CM

## 2020-06-25 DIAGNOSIS — G8929 Other chronic pain: Secondary | ICD-10-CM

## 2020-06-25 DIAGNOSIS — E118 Type 2 diabetes mellitus with unspecified complications: Secondary | ICD-10-CM

## 2020-06-25 DIAGNOSIS — K219 Gastro-esophageal reflux disease without esophagitis: Secondary | ICD-10-CM

## 2020-06-25 DIAGNOSIS — E1169 Type 2 diabetes mellitus with other specified complication: Secondary | ICD-10-CM

## 2020-06-25 MED ORDER — GLIMEPIRIDE 4 MG PO TABS: 4.0000 mg | ORAL_TABLET | Freq: Every day | ORAL | 3 refills | Status: DC

## 2020-06-25 MED ORDER — PIOGLITAZONE HCL 30 MG PO TABS: 30.0000 mg | ORAL_TABLET | Freq: Every day | ORAL | 3 refills | Status: DC

## 2020-06-25 MED ORDER — METFORMIN HCL 1000 MG PO TABS: ORAL_TABLET | ORAL | 3 refills | Status: DC

## 2020-06-25 MED ORDER — ASPIRIN EC 81 MG PO TBEC: 81.0000 mg | DELAYED_RELEASE_TABLET | Freq: Every day | ORAL | Status: DC

## 2020-06-25 MED ORDER — SIMVASTATIN 20 MG PO TABS: 20.0000 mg | ORAL_TABLET | ORAL | 3 refills | Status: DC

## 2020-06-25 NOTE — Assessment & Plan Note (Signed)
Declines labs today - would be interested in further evaluation next labwork.

## 2020-06-25 NOTE — Assessment & Plan Note (Signed)
Continue to encourage healthy diet and lifestyle choices to affect sustainable weight loss.  °

## 2020-06-25 NOTE — Assessment & Plan Note (Signed)
Continues PRN PPI - declines refill for now.

## 2020-06-25 NOTE — Assessment & Plan Note (Signed)
1+ DP bilaterally Does not describe claudication symptoms.

## 2020-06-25 NOTE — Patient Instructions (Addendum)
Check with insurance about coverage for lung cancer screening CT and let me know if interested.  Consider shingrix vaccine - price out at pharmacy.  Schedule eye exam as you're due.  Good to see you today Return as needed or in 4-6 months for diabetes follow up visit.  Health Maintenance, Male Adopting a healthy lifestyle and getting preventive care are important in promoting health and wellness. Ask your health care provider about:  The right schedule for you to have regular tests and exams.  Things you can do on your own to prevent diseases and keep yourself healthy. What should I know about diet, weight, and exercise? Eat a healthy diet  Eat a diet that includes plenty of vegetables, fruits, low-fat dairy products, and lean protein.  Do not eat a lot of foods that are high in solid fats, added sugars, or sodium.   Maintain a healthy weight Body mass index (BMI) is a measurement that can be used to identify possible weight problems. It estimates body fat based on height and weight. Your health care provider can help determine your BMI and help you achieve or maintain a healthy weight. Get regular exercise Get regular exercise. This is one of the most important things you can do for your health. Most adults should:  Exercise for at least 150 minutes each week. The exercise should increase your heart rate and make you sweat (moderate-intensity exercise).  Do strengthening exercises at least twice a week. This is in addition to the moderate-intensity exercise.  Spend less time sitting. Even light physical activity can be beneficial. Watch cholesterol and blood lipids Have your blood tested for lipids and cholesterol at 64 years of age, then have this test every 5 years. You may need to have your cholesterol levels checked more often if:  Your lipid or cholesterol levels are high.  You are older than 63 years of age.  You are at high risk for heart disease. What should I know about  cancer screening? Many types of cancers can be detected early and may often be prevented. Depending on your health history and family history, you may need to have cancer screening at various ages. This may include screening for:  Colorectal cancer.  Prostate cancer.  Skin cancer.  Lung cancer. What should I know about heart disease, diabetes, and high blood pressure? Blood pressure and heart disease  High blood pressure causes heart disease and increases the risk of stroke. This is more likely to develop in people who have high blood pressure readings, are of African descent, or are overweight.  Talk with your health care provider about your target blood pressure readings.  Have your blood pressure checked: ? Every 3-5 years if you are 75-32 years of age. ? Every year if you are 46 years old or older.  If you are between the ages of 21 and 11 and are a current or former smoker, ask your health care provider if you should have a one-time screening for abdominal aortic aneurysm (AAA). Diabetes Have regular diabetes screenings. This checks your fasting blood sugar level. Have the screening done:  Once every three years after age 27 if you are at a normal weight and have a low risk for diabetes.  More often and at a younger age if you are overweight or have a high risk for diabetes. What should I know about preventing infection? Hepatitis B If you have a higher risk for hepatitis B, you should be screened for this virus.  Talk with your health care provider to find out if you are at risk for hepatitis B infection. Hepatitis C Blood testing is recommended for:  Everyone born from [birth year] through 1963.  Anyone with known risk factors for hepatitis C. Sexually transmitted infections (STIs)  You should be screened each year for STIs, including gonorrhea and chlamydia, if: ? You are sexually active and are younger than 63 years of age. ? You are older than 63 years of age and your health  care provider tells you that you are at risk for this type of infection. ? Your sexual activity has changed since you were last screened, and you are at increased risk for chlamydia or gonorrhea. Ask your health care provider if you are at risk.  Ask your health care provider about whether you are at high risk for HIV. Your health care provider may recommend a prescription medicine to help prevent HIV infection. If you choose to take medicine to prevent HIV, you should first get tested for HIV. You should then be tested every 3 months for as long as you are taking the medicine. Follow these instructions at home: Lifestyle  Do not use any products that contain nicotine or tobacco, such as cigarettes, e-cigarettes, and chewing tobacco. If you need help quitting, ask your health care provider.  Do not use street drugs.  Do not share needles.  Ask your health care provider for help if you need support or information about quitting drugs. Alcohol use  Do not drink alcohol if your health care provider tells you not to drink.  If you drink alcohol: ? Limit how much you have to 0-2 drinks a day. ? Be aware of how much alcohol is in your drink. In the [place] one drink equals one 12 oz bottle of beer (355 mL), one 5 oz glass of wine (148 mL), or one 1 oz glass of hard liquor (44 mL). General instructions  Schedule regular health, dental, and eye exams.  Stay current with your vaccines.  Tell your health care provider if: ? You often feel depressed. ? You have ever been abused or do not feel safe at home. Summary  Adopting a healthy lifestyle and getting preventive care are important in promoting health and wellness.  Follow your health care provider's instructions about healthy diet, exercising, and getting tested or screened for diseases.  Follow your health care provider's instructions on monitoring your cholesterol and blood pressure. This information is not intended to replace advice  given to you by your health care provider. Make sure you discuss any questions you have with your health care provider. Document Revised: 01/20/2018 Document Reviewed: 01/20/2018 Elsevier Patient Education  2021 [employer].

## 2020-06-25 NOTE — Assessment & Plan Note (Signed)
Chronic, longstanding with residual numbness of bilateral legs

## 2020-06-25 NOTE — Assessment & Plan Note (Signed)
Chronic, improving. Continue current regimen of amaryl, metformin, actos.  He may look into [place] assistance for medication coverage.

## 2020-06-25 NOTE — Assessment & Plan Note (Signed)
Continue to encourage smoking cessation.  He is interested in lung cancer screening - he will check with [employer] insurance to see if that would be covered.

## 2020-06-25 NOTE — Progress Notes (Signed)
Patient ID: Logan Tanner, male    DOB: 1955-09-13, 65 y.o.   MRN: [id number]  This visit was conducted in person.  BP 138/60   Pulse 89   Temp 97.8 F (36.6 C) (Temporal)   Ht 5\' 6"  (1.676 m)   Wt 204 lb 7 oz (92.7 kg)   SpO2 94%   BMI 33.00 kg/m    CC: CPE  Subjective:   HPI: Logan Tanner is a 65 y.o. male presenting on 06/25/2020 for Annual Exam   COVID-19 infection 02/2020- symptoms fully resolved. He did receive J&J vaccine and booster.   DM - on metformin 1000mg  bid, actos 30mg  daily, amaryl 2mg  with lunch.  Notes some soreness to calves but not necessarily exertional.   Notes increasing fatigue and low energy over last several months. No depression, somewhat decreased libido. Notes increasing leg weakness ie needs assistance getting up off the floor. Notes leg numbness since work accident  Preventative: COLONOSCOPY Date: 01/2012 2 diminutive polyps, mod diverticulosis, rec rpt 10 yrs Carlean Purl)  Prostate screening - continue screening with yearly PSA  Lung cancer screening - eligible, but desires to postpone for now  Flu shot - does not receive  COVID vaccine J&J 08/2019, J&J booster 01/2020 Pneumovax 02/2011  Tetanus - 02/2010 Shingrix - discussed, he may be interested but concerned about cost - h/o shingles Seat belt use discussed Sunscreen use discussed. No changing moles on skin.  Smoking - 1 ppd. 35 PY hx. Wife also smokes.  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 Niel Hummer ([employer]) Activity: no regular exercise, stays active on the job. Encouraged increased walking  Diet: good water, fruits/vegetables daily      Relevant past medical, surgical, family  and social history reviewed and updated as indicated. Interim medical history since our last visit reviewed. Allergies and medications reviewed and updated. Outpatient Medications Prior to Visit  Medication Sig Dispense Refill  . Coenzyme Q10 (CO Q 10) 100 MG CAPS Take 1 capsule by mouth daily. 30 capsule   . naproxen sodium (ANAPROX) 220 MG tablet Take 220 mg by mouth as needed.    Marland Kitchen omeprazole (PRILOSEC) 40 MG capsule Take 1 capsule (40 mg total) by mouth daily as needed.    Marland Kitchen aspirin EC 81 MG tablet Take 1 tablet (81 mg total) by mouth every Monday, Wednesday, and Friday. Swallow whole.    Marland Kitchen glimepiride (AMARYL) 4 MG tablet Take 1 tablet (4 mg total) by mouth daily. 90 tablet 0  . metFORMIN (GLUCOPHAGE) 1000 MG tablet TAKE 1 TABLET BY MOUTH TWICE DAILY WITH A MEAL 180 tablet 0  . pioglitazone (ACTOS) 30 MG tablet Take 1 tablet (30 mg total) by mouth daily. 90 tablet 0  . simvastatin (ZOCOR) 20 MG tablet Take 1 tablet (20 mg total) by mouth every other day. 45 tablet 0   No facility-administered medications prior to visit.     Per HPI unless specifically indicated in ROS section below Review of Systems  Constitutional: Negative for activity change, appetite change, chills, fatigue, fever and unexpected weight change.  HENT: Negative for hearing loss.   Eyes: Negative for visual disturbance.  Respiratory: Positive for cough, shortness of breath (exertional) and wheezing. Negative  for chest tightness.   Cardiovascular: Positive for leg swelling. Negative for chest pain and palpitations.  Gastrointestinal: Negative for abdominal distention, abdominal pain, blood in stool, constipation, diarrhea, nausea and vomiting.  Genitourinary: Negative for difficulty urinating and hematuria.  Musculoskeletal: Negative for arthralgias, myalgias and neck pain.  Skin: Negative for rash.  Neurological: Negative for dizziness, seizures, syncope and headaches.  Hematological: Negative for adenopathy. Does  not bruise/bleed easily.  Psychiatric/Behavioral: Negative for dysphoric mood. The patient is not nervous/anxious.    Objective:  BP 138/60   Pulse 89   Temp 97.8 F (36.6 C) (Temporal)   Ht 5\' 6"  (1.676 m)   Wt 204 lb 7 oz (92.7 kg)   SpO2 94%   BMI 33.00 kg/m   Wt Readings from Last 3 Encounters:  06/25/20 204 lb 7 oz (92.7 kg)  04/06/20 213 lb 1 oz (96.6 kg)  11/28/19 211 lb 9 oz (96 kg)      Physical Exam Vitals and nursing note reviewed.  Constitutional:      General: He is not in acute distress.    Appearance: Normal appearance. He is well-developed. He is not ill-appearing.  HENT:     Head: Normocephalic and atraumatic.     Right Ear: Hearing, tympanic membrane, ear canal and external ear normal.     Left Ear: Hearing, tympanic membrane, ear canal and external ear normal.  Eyes:     General: No scleral icterus.    Extraocular Movements: Extraocular movements intact.     Conjunctiva/sclera: Conjunctivae normal.     Pupils: Pupils are equal, round, and reactive to light.  Neck:     Thyroid: No thyroid mass or thyromegaly.     Vascular: No carotid bruit.  Cardiovascular:     Rate and Rhythm: Normal rate and regular rhythm.     Pulses: Normal pulses.          Radial pulses are 2+ on the right side and 2+ on the left side.     Heart sounds: Normal heart sounds. No murmur heard.   Pulmonary:     Effort: Pulmonary effort is normal. No respiratory distress.     Breath sounds: Normal breath sounds. No wheezing, rhonchi or rales.  Abdominal:     General: Bowel sounds are normal. There is no distension.     Palpations: Abdomen is soft. There is no mass.     Tenderness: There is no abdominal tenderness. There is no guarding or rebound.     Hernia: No hernia is present.  Musculoskeletal:        General: Normal range of motion.     Cervical back: Normal range of motion and neck supple.     Right lower leg: No edema.     Left lower leg: No edema.     Comments: 1+ DP  bilaterally  Lymphadenopathy:     Cervical: No cervical adenopathy.  Skin:    General: Skin is warm and dry.     Findings: No rash.  Neurological:     General: No focal deficit present.     Mental Status: He is alert and oriented to person, place, and time.     Comments: CN grossly intact, station and gait intact  Psychiatric:        Mood and Affect: Mood normal.        Behavior: Behavior normal.        Thought Content: Thought content normal.        Judgment: Judgment  normal.       Results for orders placed or performed in visit on 04/27/20  Fructosamine  Result Value Ref Range   Fructosamine 297 (H) 205 - 285 umol/L  PSA  Result Value Ref Range   PSA 1.50 0.10 - 4.00 ng/mL  Comprehensive metabolic panel  Result Value Ref Range   Sodium 141 135 - 145 mEq/L   Potassium 4.9 3.5 - 5.1 mEq/L   Chloride 104 96 - 112 mEq/L   CO2 28 19 - 32 mEq/L   Glucose, Bld 189 (H) 70 - 99 mg/dL   BUN 21 6 - 23 mg/dL   Creatinine, Ser 0.78 0.40 - 1.50 mg/dL   Total Bilirubin 0.4 0.2 - 1.2 mg/dL   Alkaline Phosphatase 60 39 - 117 U/L   AST 15 0 - 37 U/L   ALT 22 0 - 53 U/L   Total Protein 6.7 6.0 - 8.3 g/dL   Albumin 4.3 3.5 - 5.2 g/dL   GFR 95.69 >60.00 mL/min   Calcium 9.5 8.4 - 10.5 mg/dL  Lipid panel  Result Value Ref Range   Cholesterol 146 0 - 200 mg/dL   Triglycerides 79.0 0.0 - 149.0 mg/dL   HDL 46.20 >39.00 mg/dL   VLDL 15.8 0.0 - 40.0 mg/dL   LDL Cholesterol 84 0 - 99 mg/dL   Total CHOL/HDL Ratio 3    NonHDL 99.86    Assessment & Plan:  This visit occurred during the SARS-CoV-2 public health emergency.  Safety protocols were in place, including screening questions prior to the visit, additional usage of staff PPE, and extensive cleaning of exam room while observing appropriate contact time as indicated for disinfecting solutions.   Problem List Items Addressed This Visit    Diabetes mellitus type 2, uncontrolled, with complications ([place])    Chronic, improving. Continue  current regimen of amaryl, metformin, actos.  He may look into [place] assistance for medication coverage.       Relevant Medications   aspirin EC 81 MG tablet   metFORMIN (GLUCOPHAGE) 1000 MG tablet   pioglitazone (ACTOS) 30 MG tablet   simvastatin (ZOCOR) 20 MG tablet   glimepiride (AMARYL) 4 MG tablet   Hyperlipidemia associated with type 2 diabetes mellitus (HCC)    Chronic, stable on 20mg  simvastatin QOD. The 10-year ASCVD risk score Mikey Bussing DC Brooke Bonito., et al., 2013) is: 25.3%   Values used to calculate the score:     Age: 61 years     Sex: Male     Is Non-Hispanic African American: No     Diabetic: Yes     Tobacco smoker: Yes     Systolic Blood Pressure: 100 mmHg     Is BP treated: No     HDL Cholesterol: 46.2 mg/dL     Total Cholesterol: 146 mg/dL       Relevant Medications   aspirin EC 81 MG tablet   metFORMIN (GLUCOPHAGE) 1000 MG tablet   pioglitazone (ACTOS) 30 MG tablet   simvastatin (ZOCOR) 20 MG tablet   glimepiride (AMARYL) 4 MG tablet   Smoker    Continue to encourage smoking cessation.  He is interested in lung cancer screening - he will check with [employer] insurance to see if that would be covered.      Chronic low back pain    Chronic, longstanding with residual numbness of bilateral legs      Relevant Medications   aspirin EC 81 MG tablet   Benign prostatic hyperplasia  H/o this. Consider flomax      Healthcare maintenance - Primary    Preventative protocols reviewed and updated unless pt declined. Discussed healthy diet and lifestyle.       Obesity, Class I, BMI 30-34.9    Continue to encourage healthy diet and lifestyle choices to affect sustainable weight loss.       GERD (gastroesophageal reflux disease)    Continues PRN PPI - declines refill for now.       Diminished pulses in lower extremity    1+ DP bilaterally Does not describe claudication symptoms.       Chronic fatigue    Declines labs today - would be interested in further evaluation  next labwork.           Meds ordered this encounter  Medications  . aspirin EC 81 MG tablet    Sig: Take 1 tablet (81 mg total) by mouth daily. Swallow whole.  Marland Kitchen DISCONTD: glimepiride (AMARYL) 4 MG tablet    Sig: Take 1 tablet (4 mg total) by mouth daily.    Dispense:  90 tablet    Refill:  3  . metFORMIN (GLUCOPHAGE) 1000 MG tablet    Sig: TAKE 1 TABLET BY MOUTH TWICE DAILY WITH A MEAL    Dispense:  180 tablet    Refill:  3  . pioglitazone (ACTOS) 30 MG tablet    Sig: Take 1 tablet (30 mg total) by mouth daily.    Dispense:  90 tablet    Refill:  3  . simvastatin (ZOCOR) 20 MG tablet    Sig: Take 1 tablet (20 mg total) by mouth every other day.    Dispense:  45 tablet    Refill:  3  . glimepiride (AMARYL) 4 MG tablet    Sig: Take 1 tablet (4 mg total) by mouth daily. Take with supper    Dispense:  90 tablet    Refill:  3   No orders of the defined types were placed in this encounter.   Patient instructions: Check with insurance about coverage for lung cancer screening CT and let me know if interested.  Consider shingrix vaccine - price out at pharmacy.  Schedule eye exam as you're due.  Good to see you today Return as needed or in 4-6 months for diabetes follow up visit.  Follow up plan: Return in about 4 months (around 10/26/2020), or if symptoms worsen or fail to improve, for follow up visit.  Ria Bush, MD

## 2020-06-25 NOTE — Assessment & Plan Note (Signed)
Preventative protocols reviewed and updated unless pt declined. Discussed healthy diet and lifestyle.  

## 2020-06-25 NOTE — Assessment & Plan Note (Signed)
Chronic, stable on 20mg  simvastatin QOD. The 10-year ASCVD risk score Mikey Bussing DC Brooke Bonito., et al., 2013) is: 25.3%   Values used to calculate the score:     Age: 65 years     Sex: Male     Is Non-Hispanic African American: No     Diabetic: Yes     Tobacco smoker: Yes     Systolic Blood Pressure: 034 mmHg     Is BP treated: No     HDL Cholesterol: 46.2 mg/dL     Total Cholesterol: 146 mg/dL

## 2020-06-25 NOTE — Assessment & Plan Note (Signed)
H/o this. Consider flomax

## 2020-06-27 ENCOUNTER — Other Ambulatory Visit: Payer: Self-pay

## 2020-07-04 ENCOUNTER — Encounter: Payer: Self-pay | Admitting: Family Medicine

## 2020-08-27 ENCOUNTER — Telehealth: Payer: Self-pay | Admitting: *Deleted

## 2020-08-27 NOTE — Telephone Encounter (Signed)
Noted. Nice patient. Thank you

## 2020-08-27 NOTE — Telephone Encounter (Signed)
I spoke with pt and he said for last 3 days has boil on lt leg that is painful and swollen larger than silver dollar; area rubs when pt walks. No drainage noted. No fever. No covid symptoms and no known exposure to covid. I spoke with Dr Darnell Level who does not have any available appts and pt scheduled 08/28/20 at 8:20 with Dr Diona Browner. I also put 9:00 virtual appt on hold due to extra time may be needed for pt appt. Pt will be at office at 8:00 for cking in at front desk and in preparation for appt. Pt will try moist heat on and off to affected area and take tylenol for discomfort. UC & ED precautions given and pt voiced understanding (pt does not have insurance and did not want to go to UC due to cost). Sending note to Dr Darnell Level as PCP and Dr Diona Browner.

## 2020-08-27 NOTE — Telephone Encounter (Signed)
Friend called to schedule an appt for pt for an infected boil that's red swollen and hot to the touch. Given sxs pt needed to be triaged but pt wasn't with friend and no DPR on file to speak with her directly. She will have pt call back to be officially triaged given sxs.   FYI to triage nurses and PCP

## 2020-08-28 ENCOUNTER — Other Ambulatory Visit: Payer: Self-pay

## 2020-08-28 ENCOUNTER — Ambulatory Visit (INDEPENDENT_AMBULATORY_CARE_PROVIDER_SITE_OTHER): Payer: Self-pay | Admitting: Family Medicine

## 2020-08-28 VITALS — BP 126/72 | HR 86 | Temp 98.1°F | Resp 16 | Ht 66.0 in | Wt 208.8 lb

## 2020-08-28 DIAGNOSIS — L02416 Cutaneous abscess of left lower limb: Secondary | ICD-10-CM | POA: Insufficient documentation

## 2020-08-28 DIAGNOSIS — L03116 Cellulitis of left lower limb: Secondary | ICD-10-CM

## 2020-08-28 MED ORDER — DOXYCYCLINE HYCLATE 100 MG PO TABS: 100.0000 mg | ORAL_TABLET | Freq: Two times a day (BID) | ORAL | 0 refills | Status: DC

## 2020-08-28 MED ORDER — CEFTRIAXONE SODIUM 1 G IJ SOLR
1.0000 g | Freq: Once | INTRAMUSCULAR | Status: AC
  Administered 2020-08-28: 1 g via INTRAMUSCULAR

## 2020-08-28 NOTE — Patient Instructions (Signed)
On 08/29/20 AM, remove packing.. pull out and wash with warm soapy water. On 7/20 AM start doxycycline 100 mg twice daily. Start warm compresses 3-4 times daily as able.  Keep area clean  and covered with bandage. Keep diabetes well controlled to help with infection. Call if redness spreading pain increasing.Marland Kitchen go to ER if fever on antibiotics or shortness of breath. SKIN ABSCESS A skin abscess is an infected area on or under your skin that contains a collection of pus and other material. An abscess may also be called a furuncle,carbuncle, or boil. An abscess can occur in or on almost any part of your body. Some abscesses break open (rupture) on their own. Most continue to get worse unless they are treated. The infection can spread deeper into the body and eventually into your blood, whichcan make you feel ill. Treatment usually involves draining the abscess. What are the causes? An abscess occurs when germs, like bacteria, pass through your skin and cause an infection. This may be caused by: A scrape or cut on your skin. A puncture wound through your skin, including a needle injection or insect bite. Blocked oil or sweat glands. Blocked and infected hair follicles. A cyst that forms beneath your skin (sebaceous cyst) and becomes infected. What increases the risk? This condition is more likely to develop in people who: Have a weak body defense system (immune system). Have diabetes. Have dry and irritated skin. Get frequent injections or use illegal IV drugs. Have a foreign body in a wound, such as a splinter. Have problems with their lymph system or veins. What are the signs or symptoms? Symptoms of this condition include: A painful, firm bump under the skin. A bump with pus at the top. This may break through the skin and drain. Other symptoms include: Redness surrounding the abscess site. Warmth. Swelling of the lymph nodes (glands) near the abscess. Tenderness. A sore on the  skin. How is this diagnosed? This condition may be diagnosed based on: A physical exam. Your medical history. A sample of pus. This may be used to find out what is causing the infection. Blood tests. Imaging tests, such as an ultrasound, CT scan, or MRI. How is this treated? A small abscess that drains on its own may not need treatment. Treatment for larger abscesses may include: Moist heat or heat pack applied to the area several times a day. A procedure to drain the abscess (incision and drainage). Antibiotic medicines. For a severe abscess, you may first get antibiotics through an IV and then change to antibiotics by mouth. Follow these instructions at home: Medicines  Take over-the-counter and prescription medicines only as told by your health care provider. If you were prescribed an antibiotic medicine, take it as told by your health care provider. Do not stop taking the antibiotic even if you start to feel better.  Abscess care  If you have an abscess that has not drained, apply heat to the affected area. Use the heat source that your health care provider recommends, such as a moist heat pack or a heating pad. Place a towel between your skin and the heat source. Leave the heat on for 20-30 minutes. Remove the heat if your skin turns bright red. This is especially important if you are unable to feel pain, heat, or cold. You may have a greater risk of getting burned. Follow instructions from your health care provider about how to take care of your abscess. Make sure you: Cover the abscess with  a bandage (dressing). Change your dressing or gauze as told by your health care provider. Wash your hands with soap and water before you change the dressing or gauze. If soap and water are not available, use hand sanitizer. Check your abscess every day for signs of a worsening infection. Check for: More redness, swelling, or pain. More fluid or blood. Warmth. More pus or a bad  smell.  General instructions To avoid spreading the infection: Do not share personal care items, towels, or hot tubs with others. Avoid making skin contact with other people. Keep all follow-up visits as told by your health care provider. This is important. Contact a health care provider if you have: More redness, swelling, or pain around your abscess. More fluid or blood coming from your abscess. Warm skin around your abscess. More pus or a bad smell coming from your abscess. A fever. Muscle aches. Chills or a general ill feeling. Get help right away if you: Have severe pain. See red streaks on your skin spreading away from the abscess. Summary A skin abscess is an infected area on or under your skin that contains a collection of pus and other material. A small abscess that drains on its own may not need treatment. Treatment for larger abscesses may include having a procedure to drain the abscess and taking an antibiotic. This information is not intended to replace advice given to you by your health care provider. Make sure you discuss any questions you have with your healthcare provider. Document Revised: 05/20/2018 Document Reviewed: 03/12/2017 Elsevier Patient Education  2022 [employer].

## 2020-08-28 NOTE — Progress Notes (Signed)
Patient ID: Logan Tanner, male    DOB: 1955-06-15, 65 y.o.   MRN: [id number]  This visit was conducted in person.  BP 126/72   Pulse 86   Temp 98.1 F (36.7 C)   Resp 16   Ht 5\' 6"  (1.676 m)   Wt 208 lb 12 oz (94.7 kg)   SpO2 95%   BMI 33.69 kg/m    CC: Chief Complaint  Patient presents with   Blister    Boil on the back of left leg-upper area, near groin area. Started last week. Gotten worse over the weekend    Subjective:   HPI: Logan Tanner is a 65 y.o. male pt of Dr. Darnell Level presenting on 08/28/2020 for Blister (Boil on the back of left leg-upper area, near groin area. Started last week. Gotten worse over the weekend)  In last week , he has noted 1 month ago.Marland Kitchen a knot appeared on back of left upper leg.   In last week the knot has gotten inflamed and painful.   Started applying warm compresses one a day.  Wife tried to open the lesion with a needle.  Area of swelling now about  silver dollar size.  No fever, no N/V.  Hx of diabetes, moderate control Lab Results  Component Value Date   HGBA1C 7.5 (A) 04/06/2020   Has had 2 cyst in past , years ago.. one on back and  under arm pit.  No MRSA personal or family but wfe and brother had infected cyst.  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     Relevant past medical, surgical, family and social history reviewed and updated as indicated. Interim medical history since our last visit reviewed. Allergies and medications reviewed and updated. Outpatient Medications Prior to Visit  Medication Sig Dispense Refill   aspirin EC 81 MG tablet Take 1 tablet (81 mg total) by mouth daily. Swallow whole.     Coenzyme Q10 (CO Q 10) 100 MG CAPS Take 1 capsule by mouth daily. 30 capsule    glimepiride (AMARYL) 4 MG tablet Take 1 tablet (4 mg total) by mouth daily. Take with supper 90 tablet 3   metFORMIN (GLUCOPHAGE) 1000 MG tablet TAKE 1 TABLET BY MOUTH TWICE DAILY WITH A MEAL 180 tablet 3   naproxen sodium (ANAPROX) 220 MG tablet  Take 220 mg by mouth as needed.     omeprazole (PRILOSEC) 40 MG capsule Take 1 capsule (40 mg total) by mouth daily as needed.     pioglitazone (ACTOS) 30 MG tablet Take 1 tablet (30 mg total) by mouth daily. 90 tablet 3   simvastatin (ZOCOR) 20 MG tablet Take 1 tablet (20 mg total) by mouth every other day. 45 tablet 3   No facility-administered medications prior to visit.     Per HPI unless specifically indicated in ROS section below Review of Systems  Constitutional:  Negative for fatigue and fever.  HENT:  Negative for ear pain.   Eyes:  Negative for pain.  Respiratory:  Negative for cough and shortness of breath.   Cardiovascular:  Negative for chest pain, palpitations and leg swelling.  Gastrointestinal:  Negative for abdominal pain.  Genitourinary:  Negative for dysuria.  Musculoskeletal:  Negative for arthralgias.  Neurological:  Negative for syncope, light-headedness and headaches.  Psychiatric/Behavioral:  Negative for dysphoric mood.   Objective:  BP 126/72   Pulse 86   Temp 98.1 F (36.7 C)   Resp 16   Ht 5'  6" (1.676 m)   Wt 208 lb 12 oz (94.7 kg)   SpO2 95%   BMI 33.69 kg/m   Wt Readings from Last 3 Encounters:  08/28/20 208 lb 12 oz (94.7 kg)  06/25/20 204 lb 7 oz (92.7 kg)  04/06/20 213 lb 1 oz (96.6 kg)      Physical Exam Constitutional:      Appearance: He is well-developed.  HENT:     Head: Normocephalic.     Right Ear: Hearing normal.     Left Ear: Hearing normal.     Nose: Nose normal.  Neck:     Thyroid: No thyroid mass or thyromegaly.     Vascular: No carotid bruit.     Trachea: Trachea normal.  Cardiovascular:     Rate and Rhythm: Normal rate and regular rhythm.     Pulses: Normal pulses.     Heart sounds: Heart sounds not distant. No murmur heard.   No friction rub. No gallop.     Comments: No peripheral edema Pulmonary:     Effort: Pulmonary effort is normal. No respiratory distress.     Breath sounds: Normal breath sounds.  Skin:     General: Skin is warm and dry.     Findings: Erythema, lesion and rash present.  Psychiatric:        Speech: Speech normal.        Behavior: Behavior normal.        Thought Content: Thought content normal.       Results for orders placed or performed in visit on 04/27/20  Fructosamine  Result Value Ref Range   Fructosamine 297 (H) 205 - 285 umol/L  PSA  Result Value Ref Range   PSA 1.50 0.10 - 4.00 ng/mL  Comprehensive metabolic panel  Result Value Ref Range   Sodium 141 135 - 145 mEq/L   Potassium 4.9 3.5 - 5.1 mEq/L   Chloride 104 96 - 112 mEq/L   CO2 28 19 - 32 mEq/L   Glucose, Bld 189 (H) 70 - 99 mg/dL   BUN 21 6 - 23 mg/dL   Creatinine, Ser 0.78 0.40 - 1.50 mg/dL   Total Bilirubin 0.4 0.2 - 1.2 mg/dL   Alkaline Phosphatase 60 39 - 117 U/L   AST 15 0 - 37 U/L   ALT 22 0 - 53 U/L   Total Protein 6.7 6.0 - 8.3 g/dL   Albumin 4.3 3.5 - 5.2 g/dL   GFR 95.69 >60.00 mL/min   Calcium 9.5 8.4 - 10.5 mg/dL  Lipid panel  Result Value Ref Range   Cholesterol 146 0 - 200 mg/dL   Triglycerides 79.0 0.0 - 149.0 mg/dL   HDL 46.20 >39.00 mg/dL   VLDL 15.8 0.0 - 40.0 mg/dL   LDL Cholesterol 84 0 - 99 mg/dL   Total CHOL/HDL Ratio 3    NonHDL 99.86     This visit occurred during the SARS-CoV-2 public health emergency.  Safety protocols were in place, including screening questions prior to the visit, additional usage of staff PPE, and extensive cleaning of exam room while observing appropriate contact time as indicated for disinfecting solutions.   COVID 19 screen:  No recent travel or known exposure to COVID19 The patient denies respiratory symptoms of COVID 19 at this time. The importance of social distancing was discussed today.   Assessment and Plan Pt is self pay.. so opted against sending wound culture. Will treat with doxycycline 100 mg BID given past infected cyst and  family infected cysts.  Given diabetic.. will treat with IM rocephin 1 g x 1.  I&D  Meds, vitals,  and allergies reviewed.   Indication: suspect abscess  Pt complaints of: erythema, pain, swelling  Location: left upper inner thigh  Size:4 cm  of fluctuance with surrounding erythema about 10 cm diameter. See picture.  Informed consent obtained.  Pt aware of risks not limited to but including infection, bleeding, damage to near by organs.  Prep: etoh/betadine  Anesthesia: 2%lidocaine with epi, good effect  Incision made with #15 blade  Wound explored and  no loculations present  Wound packed with iodoform gauze  Tolerated well  Routine postprocedure instructions d/w pt- remove packing in 24-48h, keep area clean and bandaged, follow up if concerns/spreading erythema/pain.   Eliezer Lofts, MD

## 2020-08-31 ENCOUNTER — Ambulatory Visit: Payer: Self-pay | Admitting: Family Medicine

## 2020-08-31 ENCOUNTER — Other Ambulatory Visit: Payer: Self-pay

## 2020-08-31 ENCOUNTER — Encounter: Payer: Self-pay | Admitting: Family Medicine

## 2020-08-31 DIAGNOSIS — L03116 Cellulitis of left lower limb: Secondary | ICD-10-CM

## 2020-08-31 DIAGNOSIS — L02416 Cutaneous abscess of left lower limb: Secondary | ICD-10-CM

## 2020-08-31 NOTE — Assessment & Plan Note (Signed)
Improving.. complete doxycycline x 10 days.  Continue warm compresses .  return precautions given.

## 2020-08-31 NOTE — Progress Notes (Signed)
Patient ID: DAVD GLAZEBROOK, male    DOB: [birth year], 65 y.o.   MRN: [id number]  This visit was conducted in person.  BP 130/60   Pulse 88   Temp 97.8 F (36.6 C) (Temporal)   Ht '5\' 6"'$  (1.676 m)   Wt 210 lb 8 oz (95.5 kg)   SpO2 96%   BMI 33.98 kg/m    CC: Chief Complaint  Patient presents with   Follow-up    Left Leg Abscess    Subjective:   HPI: AEDEN HASSINGER is a 65 y.o. male presenting on 08/31/2020 for Follow-up (Left Leg Abscess)  3 days follow up on left leg abscess/cellulitis and S/P  I and D on 08/28/2020. He is now completing doxycycline course. On day2/10.   No culture sent per pt request given self pay.   Today he reports   less pain at sore in left upper leg, still draining clear bloody discharge.No SE to oral antibiotics  Still firm around the area.   No fever, flu like symptoms.   FBS 170, later in day 110... this is usual for him.       Relevant past medical, surgical, family and social history reviewed and updated as indicated. Interim medical history since our last visit reviewed. Allergies and medications reviewed and updated. Outpatient Medications Prior to Visit  Medication Sig Dispense Refill   aspirin EC 81 MG tablet Take 1 tablet (81 mg total) by mouth daily. Swallow whole.     Coenzyme Q10 (CO Q 10) 100 MG CAPS Take 1 capsule by mouth daily. 30 capsule    doxycycline (VIBRA-TABS) 100 MG tablet Take 1 tablet (100 mg total) by mouth 2 (two) times daily. 20 tablet 0   glimepiride (AMARYL) 4 MG tablet Take 1 tablet (4 mg total) by mouth daily. Take with supper 90 tablet 3   metFORMIN (GLUCOPHAGE) 1000 MG tablet TAKE 1 TABLET BY MOUTH TWICE DAILY WITH A MEAL 180 tablet 3   naproxen sodium (ANAPROX) 220 MG tablet Take 220 mg by mouth as needed.     omeprazole (PRILOSEC) 40 MG capsule Take 1 capsule (40 mg total) by mouth daily as needed.     pioglitazone (ACTOS) 30 MG tablet Take 1 tablet (30 mg total) by mouth daily. 90 tablet 3    simvastatin (ZOCOR) 20 MG tablet Take 1 tablet (20 mg total) by mouth every other day. 45 tablet 3   No facility-administered medications prior to visit.     Per HPI unless specifically indicated in ROS section below Review of Systems  Constitutional:  Negative for fatigue and fever.  HENT:  Negative for ear pain.   Eyes:  Negative for pain.  Respiratory:  Negative for cough and shortness of breath.   Cardiovascular:  Negative for chest pain, palpitations and leg swelling.  Gastrointestinal:  Negative for abdominal pain.  Genitourinary:  Negative for dysuria.  Musculoskeletal:  Negative for arthralgias.  Neurological:  Negative for syncope, light-headedness and headaches.  Psychiatric/Behavioral:  Negative for dysphoric mood.   Objective:  BP 130/60   Pulse 88   Temp 97.8 F (36.6 C) (Temporal)   Ht '5\' 6"'$  (1.676 m)   Wt 210 lb 8 oz (95.5 kg)   SpO2 96%   BMI 33.98 kg/m   Wt Readings from Last 3 Encounters:  08/31/20 210 lb 8 oz (95.5 kg)  08/28/20 208 lb 12 oz (94.7 kg)  06/25/20 204 lb 7 oz (92.7 kg)  Physical Exam Constitutional:      Appearance: He is well-developed.  HENT:     Head: Normocephalic.     Right Ear: Hearing normal.     Left Ear: Hearing normal.     Nose: Nose normal.  Neck:     Thyroid: No thyroid mass or thyromegaly.     Vascular: No carotid bruit.     Trachea: Trachea normal.  Cardiovascular:     Rate and Rhythm: Normal rate and regular rhythm.     Pulses: Normal pulses.     Heart sounds: Heart sounds not distant. No murmur heard.   No friction rub. No gallop.     Comments: No peripheral edema Pulmonary:     Effort: Pulmonary effort is normal. No respiratory distress.     Breath sounds: Normal breath sounds.  Skin:    General: Skin is warm and dry.     Findings: No rash.     Comments: Interval improvement in erythema and induration.  Resolution of surrounding cellulitis.  No fluctuance.  Psychiatric:        Speech: Speech normal.         Behavior: Behavior normal.        Thought Content: Thought content normal.        Results for orders placed or performed in visit on 04/27/20  Fructosamine  Result Value Ref Range   Fructosamine 297 (H) 205 - 285 umol/L  PSA  Result Value Ref Range   PSA 1.50 0.10 - 4.00 ng/mL  Comprehensive metabolic panel  Result Value Ref Range   Sodium 141 135 - 145 mEq/L   Potassium 4.9 3.5 - 5.1 mEq/L   Chloride 104 96 - 112 mEq/L   CO2 28 19 - 32 mEq/L   Glucose, Bld 189 (H) 70 - 99 mg/dL   BUN 21 6 - 23 mg/dL   Creatinine, Ser 0.78 0.40 - 1.50 mg/dL   Total Bilirubin 0.4 0.2 - 1.2 mg/dL   Alkaline Phosphatase 60 39 - 117 U/L   AST 15 0 - 37 U/L   ALT 22 0 - 53 U/L   Total Protein 6.7 6.0 - 8.3 g/dL   Albumin 4.3 3.5 - 5.2 g/dL   GFR 95.69 >60.00 mL/min   Calcium 9.5 8.4 - 10.5 mg/dL  Lipid panel  Result Value Ref Range   Cholesterol 146 0 - 200 mg/dL   Triglycerides 79.0 0.0 - 149.0 mg/dL   HDL 46.20 >39.00 mg/dL   VLDL 15.8 0.0 - 40.0 mg/dL   LDL Cholesterol 84 0 - 99 mg/dL   Total CHOL/HDL Ratio 3    NonHDL 99.86     This visit occurred during the SARS-CoV-2 public health emergency.  Safety protocols were in place, including screening questions prior to the visit, additional usage of staff PPE, and extensive cleaning of exam room while observing appropriate contact time as indicated for disinfecting solutions.   COVID 19 screen:  No recent travel or known exposure to COVID19 The patient denies respiratory symptoms of COVID 19 at this time. The importance of social distancing was discussed today.   Assessment and Plan    Problem List Items Addressed This Visit     Cellulitis and abscess of left leg    Improving.. complete doxycycline x 10 days.  Continue warm compresses .  return precautions given.          Eliezer Lofts, MD

## 2020-08-31 NOTE — Patient Instructions (Signed)
Continue warm compresses.  Complete antibiotics.  Call if pain returning or new fever.

## 2021-04-11 ENCOUNTER — Other Ambulatory Visit: Payer: Self-pay | Admitting: Family Medicine

## 2021-04-12 NOTE — Telephone Encounter (Signed)
E-scribed refill.  Plz schedule lab and cpe visits.  

## 2021-04-22 ENCOUNTER — Telehealth: Payer: Self-pay | Admitting: Family Medicine

## 2021-04-22 NOTE — Telephone Encounter (Signed)
If allergy related, recommend he try antihistamine like claritin or zyrtec and also may try OTC cough syrup like robitussin or delsym.  ?If not better with this, we could do prescription cough medication but will likely just recommend seeing him on Wednesday.  ?

## 2021-04-22 NOTE — Telephone Encounter (Signed)
Spoke to patient by telephone and was advised that he started with allergy symptoms last week. ?Patient stated now he has a productive cough/white and chest congestion.  Patient denies a fever or SOB. Patient stated that he did a home covid test yesterday and it was negative. Patient stated he has been taking Robitussin DM during the day and Nyquil at night. Patient was offered a sooner appointment with another provider which he declined stating that he would just like to wait and see Dr. Danise Mina. Patient was given ER precautions and he verbalized understanding.Patient wanted to know what Dr. Danise Mina would recommend that he take until his appointment. ?Pharmacy [facility] ?

## 2021-04-22 NOTE — Telephone Encounter (Signed)
Patient notified as instructed by telephone and verbalized understanding. Patient stated that he did take some generic Claritin when the symptoms first started. Patient stated that he has been taking zinc also. Patient stated that he is having real bad coughing spells and works around [employer]. Patient stated that he has been taking Robitussin but that does not seem to help the bad coughing spells. Patient stated that he has had some tightness in his chest and feels that the congestion that he had in his head has moved down to his chest. Patient was again given ER precautions and he verbalized understanding.  ?

## 2021-04-22 NOTE — Telephone Encounter (Signed)
Noted. Will see Wed. Has been provided ER precautions.  ?

## 2021-04-22 NOTE — Telephone Encounter (Signed)
Pt called stating that he has a nasty cough and would like something called in. I scheduled pt for 04/24/21 but pt would like something called in before then. Please advise ?

## 2021-04-24 ENCOUNTER — Encounter: Payer: Self-pay | Admitting: Family Medicine

## 2021-04-24 ENCOUNTER — Ambulatory Visit (INDEPENDENT_AMBULATORY_CARE_PROVIDER_SITE_OTHER): Payer: Self-pay | Admitting: Family Medicine

## 2021-04-24 ENCOUNTER — Other Ambulatory Visit: Payer: Self-pay

## 2021-04-24 VITALS — BP 136/70 | HR 76 | Temp 97.8°F | Ht 66.0 in | Wt 210.4 lb

## 2021-04-24 DIAGNOSIS — J069 Acute upper respiratory infection, unspecified: Secondary | ICD-10-CM | POA: Insufficient documentation

## 2021-04-24 DIAGNOSIS — F17200 Nicotine dependence, unspecified, uncomplicated: Secondary | ICD-10-CM

## 2021-04-24 MED ORDER — CHERATUSSIN AC 100-10 MG/5ML PO SOLN: 5.0000 mL | Freq: Three times a day (TID) | ORAL | 0 refills | Status: DC | PRN

## 2021-04-24 NOTE — Assessment & Plan Note (Signed)
Story/exam consistent with viral URI with cough, possible bronchitis. Supportive measures reviewed. Rx cheratussin cough syrup for night time cough. Update if not improving with treatment as expected. Letter provided for work.  ?

## 2021-04-24 NOTE — Assessment & Plan Note (Signed)
1ppd smoker, albeit less this past week. ?Encouraged full smoking cessation.  ?Discussed lung cancer screening CT - he desires to postpone as he's self pay.  ?Lungs sound clear today.  ?

## 2021-04-24 NOTE — Patient Instructions (Addendum)
You had a viral upper respiratory infection. ?Antibiotics are not needed for this. Viral infections usually take 7-10 days to resolve. The cough can last a few weeks to go away. ?Use medication as prescribed: codeine cough syrup  ?Push fluids and plenty of rest. ?Please return if you are not improving as expected, or if you have high fevers (>101.5) or difficulty swallowing or worsening productive cough. ?Call clinic with questions.  Good to see you today. I hope you start feeling better soon.  ?

## 2021-04-24 NOTE — Progress Notes (Signed)
? ? Patient ID: Logan Tanner, male    DOB: January 08, 1956, 66 y.o.   MRN: [id number] ? ?This visit was conducted in person. ? ?BP 136/70   Pulse 76   Temp 97.8 ?F (36.6 ?C) (Temporal)   Ht '5\' 6"'$  (1.676 m)   Wt 210 lb 6 oz (95.4 kg)   SpO2 95%   BMI 33.96 kg/m?   ? ?CC: cough ?Subjective:  ? ?HPI: ?Logan Tanner is a 66 y.o. male presenting on 04/24/2021 for Cough (C/o cough, improving.  Also had runny nose and fatigue- sxs have since resolved.  Started about 1 wk ago. Tried OTC allergy med and Robitussin, helpful.  Neg home COVID test- 04/21/21.) ? ? ?Almost 1 wk h/o cough, congestion, rhinorrhea and fatigue. He tested negative for COVID 04/21/2021. Stayed in bed all day over the weekend. Monday went to work but had significant coughing spell. Stayed out of work yesterday.  ?Treating with OTC allergy medications, mucous relief and robitussin with benefit.  ?No significant h/o allergies although he notes this may have started after blowing leaves outdoors. He also recently treated mattress for bedbugs (lemonseed oil?).  ? ?Chest congestion, however unable to cough up mucous.  ?No fevers/chills, dyspnea. No post tussive emesis. No ST, ear or tooth pain.  ?Wife also sick.  ?Has decreased smoking this past week. Normally 1ppd  ?   ? ?Relevant past medical, surgical, family and social history reviewed and updated as indicated. Interim medical history since our last visit reviewed. ?Allergies and medications reviewed and updated. ?Outpatient Medications Prior to Visit  ?Medication Sig Dispense Refill  ? aspirin EC 81 MG tablet Take 1 tablet (81 mg total) by mouth daily. Swallow whole.    ? Coenzyme Q10 (CO Q 10) 100 MG CAPS Take 1 capsule by mouth daily. 30 capsule   ? glimepiride (AMARYL) 4 MG tablet Take 1 tablet (4 mg total) by mouth daily. Take with supper 90 tablet 3  ? metFORMIN (GLUCOPHAGE) 1000 MG tablet TAKE 1 TABLET BY MOUTH TWICE DAILY WITH A MEAL 180 tablet 0  ? naproxen sodium (ANAPROX) 220 MG tablet  Take 220 mg by mouth as needed.    ? omeprazole (PRILOSEC) 40 MG capsule Take 1 capsule (40 mg total) by mouth daily as needed.    ? pioglitazone (ACTOS) 30 MG tablet Take 1 tablet (30 mg total) by mouth daily. 90 tablet 3  ? simvastatin (ZOCOR) 20 MG tablet Take 1 tablet (20 mg total) by mouth every other day. 45 tablet 3  ? doxycycline (VIBRA-TABS) 100 MG tablet Take 1 tablet (100 mg total) by mouth 2 (two) times daily. 20 tablet 0  ? ?No facility-administered medications prior to visit.  ?  ? ?Per HPI unless specifically indicated in ROS section below ?Review of Systems ? ?Objective:  ?BP 136/70   Pulse 76   Temp 97.8 ?F (36.6 ?C) (Temporal)   Ht '5\' 6"'$  (1.676 m)   Wt 210 lb 6 oz (95.4 kg)   SpO2 95%   BMI 33.96 kg/m?   ?Wt Readings from Last 3 Encounters:  ?04/24/21 210 lb 6 oz (95.4 kg)  ?08/31/20 210 lb 8 oz (95.5 kg)  ?08/28/20 208 lb 12 oz (94.7 kg)  ?  ?  ?Physical Exam ?Vitals and nursing note reviewed.  ?Constitutional:   ?   Appearance: Normal appearance. He is not ill-appearing.  ?HENT:  ?   Head: Normocephalic and atraumatic.  ?   Right Ear: Hearing, tympanic membrane,  ear canal and external ear normal. There is no impacted cerumen.  ?   Left Ear: Hearing, tympanic membrane, ear canal and external ear normal. There is no impacted cerumen.  ?   Nose: Nose normal. No mucosal edema, congestion or rhinorrhea.  ?   Right Turbinates: Not enlarged or swollen.  ?   Left Turbinates: Not enlarged or swollen.  ?   Right Sinus: No maxillary sinus tenderness or frontal sinus tenderness.  ?   Left Sinus: No maxillary sinus tenderness or frontal sinus tenderness.  ?   Mouth/Throat:  ?   Mouth: Mucous membranes are moist.  ?   Pharynx: Oropharynx is clear. No oropharyngeal exudate or posterior oropharyngeal erythema.  ?Eyes:  ?   Extraocular Movements: Extraocular movements intact.  ?   Conjunctiva/sclera: Conjunctivae normal.  ?   Pupils: Pupils are equal, round, and reactive to light.  ?Cardiovascular:  ?    Rate and Rhythm: Normal rate and regular rhythm.  ?   Pulses: Normal pulses.  ?   Heart sounds: Normal heart sounds. No murmur heard. ?Pulmonary:  ?   Effort: Pulmonary effort is normal. No respiratory distress.  ?   Breath sounds: Normal breath sounds. No wheezing, rhonchi or rales.  ?Musculoskeletal:  ?   Cervical back: Normal range of motion and neck supple. No rigidity.  ?   Right lower leg: No edema.  ?   Left lower leg: No edema.  ?Lymphadenopathy:  ?   Cervical: No cervical adenopathy.  ?Skin: ?   General: Skin is warm and dry.  ?   Findings: No rash.  ?Neurological:  ?   Mental Status: He is alert.  ?Psychiatric:     ?   Mood and Affect: Mood normal.     ?   Behavior: Behavior normal.  ? ?   ? ?Assessment & Plan:  ?This visit occurred during the SARS-CoV-2 public health emergency.  Safety protocols were in place, including screening questions prior to the visit, additional usage of staff PPE, and extensive cleaning of exam room while observing appropriate contact time as indicated for disinfecting solutions.  ? ?Problem List Items Addressed This Visit   ? ? Smoker  ?  1ppd smoker, albeit less this past week. ?Encouraged full smoking cessation.  ?Discussed lung cancer screening CT - he desires to postpone as he's self pay.  ?Lungs sound clear today.  ?  ?  ? Viral URI with cough - Primary  ?  Story/exam consistent with viral URI with cough, possible bronchitis. Supportive measures reviewed. Rx cheratussin cough syrup for night time cough. Update if not improving with treatment as expected. Letter provided for work.  ?  ?  ?  ? ?Meds ordered this encounter  ?Medications  ? guaiFENesin-codeine (CHERATUSSIN AC) 100-10 MG/5ML syrup  ?  Sig: Take 5 mLs by mouth 3 (three) times daily as needed for cough.  ?  Dispense:  120 mL  ?  Refill:  0  ? ?No orders of the defined types were placed in this encounter. ? ? ? ?Patient Instructions  ?You had a viral upper respiratory infection. ?Antibiotics are not needed for  this. Viral infections usually take 7-10 days to resolve. The cough can last a few weeks to go away. ?Use medication as prescribed: codeine cough syrup  ?Push fluids and plenty of rest. ?Please return if you are not improving as expected, or if you have high fevers (>101.5) or difficulty swallowing or worsening productive cough. ?Call clinic  with questions.  Good to see you today. I hope you start feeling better soon.  ? ?Follow up plan: ?Return if symptoms worsen or fail to improve. ? ?Ria Bush, MD   ?

## 2021-05-16 ENCOUNTER — Other Ambulatory Visit: Payer: Self-pay | Admitting: Family Medicine

## 2021-05-17 NOTE — Telephone Encounter (Signed)
I spoke to pt about setting up his CPE after Jun 24, 2021. He needs early morning. There are no early mornings available through June, Please advise if he can be worked in. ?

## 2021-05-19 NOTE — Telephone Encounter (Signed)
May schedule at 8am May 19th Friday. Thanks.  ?

## 2021-05-20 NOTE — Telephone Encounter (Signed)
Spoke to patient by telephone and appointment scheduled as instructed. ?

## 2021-06-21 ENCOUNTER — Encounter: Payer: Self-pay | Admitting: Family Medicine

## 2021-06-28 ENCOUNTER — Ambulatory Visit: Payer: Self-pay | Admitting: Family Medicine

## 2021-07-04 ENCOUNTER — Other Ambulatory Visit: Payer: Self-pay | Admitting: Family Medicine

## 2021-07-05 ENCOUNTER — Telehealth: Payer: Self-pay | Admitting: Family Medicine

## 2021-07-05 ENCOUNTER — Ambulatory Visit (INDEPENDENT_AMBULATORY_CARE_PROVIDER_SITE_OTHER): Payer: Self-pay | Admitting: Family Medicine

## 2021-07-05 ENCOUNTER — Encounter: Payer: Self-pay | Admitting: Family Medicine

## 2021-07-05 VITALS — BP 130/72 | HR 82 | Temp 97.7°F | Ht 66.75 in | Wt 210.2 lb

## 2021-07-05 DIAGNOSIS — R351 Nocturia: Secondary | ICD-10-CM

## 2021-07-05 DIAGNOSIS — F17200 Nicotine dependence, unspecified, uncomplicated: Secondary | ICD-10-CM

## 2021-07-05 DIAGNOSIS — N401 Enlarged prostate with lower urinary tract symptoms: Secondary | ICD-10-CM

## 2021-07-05 DIAGNOSIS — K219 Gastro-esophageal reflux disease without esophagitis: Secondary | ICD-10-CM

## 2021-07-05 DIAGNOSIS — E1169 Type 2 diabetes mellitus with other specified complication: Secondary | ICD-10-CM

## 2021-07-05 DIAGNOSIS — E785 Hyperlipidemia, unspecified: Secondary | ICD-10-CM

## 2021-07-05 DIAGNOSIS — G8929 Other chronic pain: Secondary | ICD-10-CM

## 2021-07-05 DIAGNOSIS — E669 Obesity, unspecified: Secondary | ICD-10-CM

## 2021-07-05 DIAGNOSIS — E66811 Obesity, class 1: Secondary | ICD-10-CM

## 2021-07-05 DIAGNOSIS — M545 Low back pain, unspecified: Secondary | ICD-10-CM

## 2021-07-05 DIAGNOSIS — R200 Anesthesia of skin: Secondary | ICD-10-CM

## 2021-07-05 DIAGNOSIS — Z0000 Encounter for general adult medical examination without abnormal findings: Secondary | ICD-10-CM

## 2021-07-05 DIAGNOSIS — Z125 Encounter for screening for malignant neoplasm of prostate: Secondary | ICD-10-CM

## 2021-07-05 LAB — MICROALBUMIN / CREATININE URINE RATIO
Creatinine,U: 68.4 mg/dL
Microalb Creat Ratio: 4.4 mg/g (ref 0.0–30.0)
Microalb, Ur: 3 mg/dL — ABNORMAL HIGH (ref 0.0–1.9)

## 2021-07-05 LAB — CBC WITH DIFFERENTIAL/PLATELET
Basophils Absolute: 0.1 10*3/uL (ref 0.0–0.1)
Basophils Relative: 1.4 % (ref 0.0–3.0)
Eosinophils Absolute: 0.6 10*3/uL (ref 0.0–0.7)
Eosinophils Relative: 7.5 % — ABNORMAL HIGH (ref 0.0–5.0)
HCT: 44.7 % (ref 39.0–52.0)
Hemoglobin: 14.9 g/dL (ref 13.0–17.0)
Lymphocytes Relative: 26.6 % (ref 12.0–46.0)
Lymphs Abs: 2 10*3/uL (ref 0.7–4.0)
MCHC: 33.3 g/dL (ref 30.0–36.0)
MCV: 94 fl (ref 78.0–100.0)
Monocytes Absolute: 0.4 10*3/uL (ref 0.1–1.0)
Monocytes Relative: 6.1 % (ref 3.0–12.0)
Neutro Abs: 4.3 10*3/uL (ref 1.4–7.7)
Neutrophils Relative %: 58.4 % (ref 43.0–77.0)
Platelets: 256 10*3/uL (ref 150.0–400.0)
RBC: 4.76 Mil/uL (ref 4.22–5.81)
RDW: 13.8 % (ref 11.5–15.5)
WBC: 7.4 10*3/uL (ref 4.0–10.5)

## 2021-07-05 LAB — LIPID PANEL
Cholesterol: 160 mg/dL (ref 0–200)
HDL: 55.9 mg/dL (ref >39.00)
LDL Cholesterol: 81 mg/dL (ref 0–99)
NonHDL: 103.78
Total CHOL/HDL Ratio: 3
Triglycerides: 113 mg/dL (ref 0.0–149.0)
VLDL: 22.6 mg/dL (ref 0.0–40.0)

## 2021-07-05 LAB — COMPREHENSIVE METABOLIC PANEL
ALT: 28 U/L (ref 0–53)
AST: 19 U/L (ref 0–37)
Albumin: 4.5 g/dL (ref 3.5–5.2)
Alkaline Phosphatase: 60 U/L (ref 39–117)
BUN: 13 mg/dL (ref 6–23)
CO2: 26 mEq/L (ref 19–32)
Calcium: 9.9 mg/dL (ref 8.4–10.5)
Chloride: 101 mEq/L (ref 96–112)
Creatinine, Ser: 0.79 mg/dL (ref 0.40–1.50)
GFR: 94.53 mL/min (ref >60.00)
Glucose, Bld: 188 mg/dL — ABNORMAL HIGH (ref 70–99)
Potassium: 4.7 mEq/L (ref 3.5–5.1)
Sodium: 139 mEq/L (ref 135–145)
Total Bilirubin: 0.6 mg/dL (ref 0.2–1.2)
Total Protein: 7 g/dL (ref 6.0–8.3)

## 2021-07-05 LAB — VITAMIN B12: Vitamin B-12: 367 pg/mL (ref 211–911)

## 2021-07-05 LAB — TSH: TSH: 2.54 u[IU]/mL (ref 0.35–5.50)

## 2021-07-05 LAB — HEMOGLOBIN A1C: Hgb A1c MFr Bld: 8.1 % — ABNORMAL HIGH (ref 4.6–6.5)

## 2021-07-05 LAB — PSA: PSA: 1.66 ng/mL (ref 0.10–4.00)

## 2021-07-05 MED ORDER — PIOGLITAZONE HCL 30 MG PO TABS: 30.0000 mg | ORAL_TABLET | Freq: Every day | ORAL | 3 refills | Status: DC

## 2021-07-05 MED ORDER — SIMVASTATIN 20 MG PO TABS: 20.0000 mg | ORAL_TABLET | ORAL | 3 refills | Status: DC

## 2021-07-05 MED ORDER — GLIMEPIRIDE 4 MG PO TABS: 4.0000 mg | ORAL_TABLET | Freq: Every day | ORAL | 3 refills | Status: DC

## 2021-07-05 MED ORDER — OMEPRAZOLE 40 MG PO CPDR: 40.0000 mg | DELAYED_RELEASE_CAPSULE | Freq: Every day | ORAL | 1 refills | Status: DC | PRN

## 2021-07-05 MED ORDER — METFORMIN HCL 1000 MG PO TABS: 1000.0000 mg | ORAL_TABLET | Freq: Two times a day (BID) | ORAL | 3 refills | Status: DC

## 2021-07-05 NOTE — Assessment & Plan Note (Signed)
Encourage healthy diet and lifestyle choices for sustainable weight loss.

## 2021-07-05 NOTE — Patient Instructions (Addendum)
Labs today  I will touch base with pharmacist about patient assistance program for injectable diabetes medicines trulicity or ozempic.  Good to see you today  Return in 6 months for diabetes follow up.   Health Maintenance, Male Adopting a healthy lifestyle and getting preventive care are important in promoting health and wellness. Ask your health care provider about: The right schedule for you to have regular tests and exams. Things you can do on your own to prevent diseases and keep yourself healthy. What should I know about diet, weight, and exercise? Eat a healthy diet  Eat a diet that includes plenty of vegetables, fruits, low-fat dairy products, and lean protein. Do not eat a lot of foods that are high in solid fats, added sugars, or sodium. Maintain a healthy weight Body mass index (BMI) is a measurement that can be used to identify possible weight problems. It estimates body fat based on height and weight. Your health care provider can help determine your BMI and help you achieve or maintain a healthy weight. Get regular exercise Get regular exercise. This is one of the most important things you can do for your health. Most adults should: Exercise for at least 150 minutes each week. The exercise should increase your heart rate and make you sweat (moderate-intensity exercise). Do strengthening exercises at least twice a week. This is in addition to the moderate-intensity exercise. Spend less time sitting. Even light physical activity can be beneficial. Watch cholesterol and blood lipids Have your blood tested for lipids and cholesterol at 64 years of age, then have this test every 5 years. You may need to have your cholesterol levels checked more often if: Your lipid or cholesterol levels are high. You are older than 64 years of age. You are at high risk for heart disease. What should I know about cancer screening? Many types of cancers can be detected early and may often be  prevented. Depending on your health history and family history, you may need to have cancer screening at various ages. This may include screening for: Colorectal cancer. Prostate cancer. Skin cancer. Lung cancer. What should I know about heart disease, diabetes, and high blood pressure? Blood pressure and heart disease High blood pressure causes heart disease and increases the risk of stroke. This is more likely to develop in people who have high blood pressure readings or are overweight. Talk with your health care provider about your target blood pressure readings. Have your blood pressure checked: Every 3-5 years if you are 80-1 years of age. Every year if you are 40 years old or older. If you are between the ages of 64 and 87 and are a current or former smoker, ask your health care provider if you should have a one-time screening for abdominal aortic aneurysm (AAA). Diabetes Have regular diabetes screenings. This checks your fasting blood sugar level. Have the screening done: Once every three years after age 30 if you are at a normal weight and have a low risk for diabetes. More often and at a younger age if you are overweight or have a high risk for diabetes. What should I know about preventing infection? Hepatitis B If you have a higher risk for hepatitis B, you should be screened for this virus. Talk with your health care provider to find out if you are at risk for hepatitis B infection. Hepatitis C Blood testing is recommended for: Everyone born from [birth year] through 1963. Anyone with known risk factors for hepatitis C. Sexually transmitted  infections (STIs) You should be screened each year for STIs, including gonorrhea and chlamydia, if: You are sexually active and are younger than 64 years of age. You are older than 64 years of age and your health care provider tells you that you are at risk for this type of infection. Your sexual activity has changed since you were last screened,  and you are at increased risk for chlamydia or gonorrhea. Ask your health care provider if you are at risk. Ask your health care provider about whether you are at high risk for HIV. Your health care provider may recommend a prescription medicine to help prevent HIV infection. If you choose to take medicine to prevent HIV, you should first get tested for HIV. You should then be tested every 3 months for as long as you are taking the medicine. Follow these instructions at home: Alcohol use Do not drink alcohol if your health care provider tells you not to drink. If you drink alcohol: Limit how much you have to 0-2 drinks a day. Know how much alcohol is in your drink. In the [place] one drink equals one 12 oz bottle of beer (355 mL), one 5 oz glass of wine (148 mL), or one 1 oz glass of hard liquor (44 mL). Lifestyle Do not use any products that contain nicotine or tobacco. These products include cigarettes, chewing tobacco, and vaping devices, such as e-cigarettes. If you need help quitting, ask your health care provider. Do not use street drugs. Do not share needles. Ask your health care provider for help if you need support or information about quitting drugs. General instructions Schedule regular health, dental, and eye exams. Stay current with your vaccines. Tell your health care provider if: You often feel depressed. You have ever been abused or do not feel safe at home. Summary Adopting a healthy lifestyle and getting preventive care are important in promoting health and wellness. Follow your health care provider's instructions about healthy diet, exercising, and getting tested or screened for diseases. Follow your health care provider's instructions on monitoring your cholesterol and blood pressure. This information is not intended to replace advice given to you by your health care provider. Make sure you discuss any questions you have with your health care provider. Document Revised:  06/18/2020 Document Reviewed: 06/18/2020 Elsevier Patient Education  [place].

## 2021-07-05 NOTE — Assessment & Plan Note (Signed)
Update PSA 

## 2021-07-05 NOTE — Assessment & Plan Note (Signed)
Chronic, stable on simvastatin '20mg'$  QOD - update FLP. The 10-year ASCVD risk score (Arnett DK, et al., 2019) is: 24.4%   Values used to calculate the score:     Age: 66 years     Sex: Male     Is Non-Hispanic African American: No     Diabetic: Yes     Tobacco smoker: Yes     Systolic Blood Pressure: 856 mmHg     Is BP treated: No     HDL Cholesterol: 46.2 mg/dL     Total Cholesterol: 146 mg/dL

## 2021-07-05 NOTE — Assessment & Plan Note (Signed)
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 He is on both metformin and PPI which can predispose to B12 deficiency - update b12 levels today. Check other neuropathy labs including CBC, TSH.

## 2021-07-05 NOTE — Assessment & Plan Note (Addendum)
1ppd smoker, encouraged ongoing cessation attempts. Contemplative.  Declines lung cancer screening program at this time due to concerns over costs.
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
## 2021-07-05 NOTE — Telephone Encounter (Signed)
Patient is self pay - would like to see if he would qualify for patient assistance for GLP1RA. I placed [id number] referral for CCM.  Thank you!

## 2021-07-05 NOTE — Assessment & Plan Note (Signed)
Preventative protocols reviewed and updated unless pt declined. Discussed healthy diet and lifestyle.  

## 2021-07-05 NOTE — Assessment & Plan Note (Signed)
Chronic, stable period on omeprazole PRN.

## 2021-07-05 NOTE — Assessment & Plan Note (Addendum)
Chronic, stable on current regimen. Update labs.  Discussed strategies to avoid hypoglycemic symptoms.  Actos is contributing to pedal edema.  He would be good candidate for GLP1RA - will see if pt qualifies for PAP.

## 2021-07-05 NOTE — Assessment & Plan Note (Signed)
Chronic, longstanding, with residual numbness to L anterior thigh and right lateral foot.

## 2021-07-13 LAB — FRUCTOSAMINE: Fructosamine: 334 umol/L — ABNORMAL HIGH (ref 205–285)

## 2021-07-13 LAB — VITAMIN B1: Vitamin B1 (Thiamine): 10 nmol/L (ref 8–30)

## 2021-07-15 ENCOUNTER — Telehealth: Payer: Self-pay

## 2021-07-15 MED ORDER — OZEMPIC (0.25 OR 0.5 MG/DOSE) 2 MG/3ML ~~LOC~~ SOPN: PEN_INJECTOR | SUBCUTANEOUS | 4 refills | Status: DC

## 2021-07-15 NOTE — Telephone Encounter (Signed)
Spoke with patient. He is uninsured and his income is < 400% [employer] so he will qualify for Ozempic patient assistance. Reviewed Ozempic benefits and dosing, pt agrees to start medication and pursue PAP.  [employer] forms and patient will come to office to sign paperwork and bring proof of income. Left forms at front desk for patient with instructions.

## 2021-07-15 NOTE — Addendum Note (Signed)
Addended by: Charlton Haws on: 07/15/2021 11:42 AM   Modules accepted: Orders

## 2021-07-15 NOTE — Telephone Encounter (Signed)
Great thank you!

## 2021-07-22 ENCOUNTER — Other Ambulatory Visit: Payer: Self-pay | Admitting: Family Medicine

## 2021-07-29 ENCOUNTER — Telehealth: Payer: Self-pay

## 2021-07-29 NOTE — Progress Notes (Signed)
Patient has been approved for patient assistance through [employer] for [employer]. Approval is 07/29/2021 - 02/09/2022. Medication will be delivered to the office in 10 - 14 business days. Patient is set up for auto-refill.  Patient was called and informed.  Charlene Brooke, CPP notified  Marijean Niemann, [place] Clinical Pharmacy Assistant [phone]

## 2021-08-05 ENCOUNTER — Ambulatory Visit (INDEPENDENT_AMBULATORY_CARE_PROVIDER_SITE_OTHER): Payer: Self-pay | Admitting: Family Medicine

## 2021-08-05 ENCOUNTER — Telehealth: Payer: Self-pay

## 2021-08-05 ENCOUNTER — Encounter: Payer: Self-pay | Admitting: Family Medicine

## 2021-08-05 ENCOUNTER — Ambulatory Visit: Payer: Self-pay

## 2021-08-05 VITALS — BP 124/70 | HR 89 | Temp 97.3°F | Ht 66.75 in | Wt 212.1 lb

## 2021-08-05 DIAGNOSIS — E1169 Type 2 diabetes mellitus with other specified complication: Secondary | ICD-10-CM

## 2021-08-05 DIAGNOSIS — R42 Dizziness and giddiness: Secondary | ICD-10-CM | POA: Insufficient documentation

## 2021-08-05 DIAGNOSIS — F17200 Nicotine dependence, unspecified, uncomplicated: Secondary | ICD-10-CM

## 2021-08-05 DIAGNOSIS — E785 Hyperlipidemia, unspecified: Secondary | ICD-10-CM

## 2021-08-05 MED ORDER — MECLIZINE HCL 25 MG PO TABS: 25.0000 mg | ORAL_TABLET | Freq: Three times a day (TID) | ORAL | 0 refills | Status: DC | PRN

## 2021-08-05 MED ORDER — VITAMIN B-12 1000 MCG PO TABS: 1000.0000 ug | ORAL_TABLET | ORAL | Status: DC

## 2021-08-05 NOTE — Telephone Encounter (Signed)
Patient would like to come today at 415. He said that he has tried contacting the UC to cancel but to cancel you have to go through [place] and is unable to get ahold of anyone in office.

## 2021-08-05 NOTE — Telephone Encounter (Signed)
Pt seen in office today.

## 2021-08-06 ENCOUNTER — Ambulatory Visit
Admission: RE | Admit: 2021-08-06 | Discharge: 2021-08-06 | Disposition: A | Discharge status: Home / Self Care | Payer: Self-pay | Source: Ambulatory Visit | Attending: Family Medicine | Admitting: Family Medicine

## 2021-08-06 DIAGNOSIS — R42 Dizziness and giddiness: Secondary | ICD-10-CM | POA: Insufficient documentation

## 2021-08-07 ENCOUNTER — Telehealth: Payer: Self-pay

## 2021-08-07 NOTE — Telephone Encounter (Signed)
I don't see any calls made from our office to pt other than yesterday at 2:32 PM.  I spoke with the pt scheduling OV at 4:30.  Pt was seen yesterday.

## 2021-08-07 NOTE — Telephone Encounter (Signed)
PLEASE NOTE: All timestamps contained within this report are represented as [place] Standard Time. CONFIDENTIALTY NOTICE: This fax transmission is intended only for the addressee. It contains information that is legally privileged, confidential or otherwise protected from use or disclosure. If you are not the intended recipient, you are strictly prohibited from reviewing, disclosing, copying using or disseminating any of this information or taking any action in reliance on or regarding this information. If you have received this fax in error, please notify us immediately by telephone so that we can arrange for its return to [place]. Phone: [phone], Toll-Free: [phone], Fax: [phone] Page: 1 of 1 Call Id: [id number] [name] - Client Nonclinical Telephone Record  [facility] Client Logan Tanner - Client Client Site [place] Provider Ria Bush - MD Contact Type Call Who Is Calling Patient / Member / Family / Caregiver Caller Name [place] Phone Number [phone] Call Type Message Only Information Provided Reason for Call Returning a Call from the Office Initial [place] states he is returning a call from the office. Additional Comment Office hours provided. Disp. Time Disposition Final User 08/06/2021 6:10:20 PM General Information Provided Yes Perla-Benitez, Jocelyn Call Closed By: Claris Gladden Transaction Date/Time: 08/06/2021 6:08:06 PM (ET)

## 2021-08-07 NOTE — Telephone Encounter (Signed)
Noted  

## 2021-08-07 NOTE — Telephone Encounter (Signed)
Patient states he is returning a phone call, but I didn't see any documentation.

## 2021-08-07 NOTE — Telephone Encounter (Signed)
Pt called back and said he thought it was about his CT he had done yesterday but I let him know that Dr Darnell Level hasnt had a chance to review. He requested a call back once reviewed at [phone]

## 2021-08-08 ENCOUNTER — Telehealth: Payer: Self-pay

## 2021-08-08 DIAGNOSIS — R42 Dizziness and giddiness: Secondary | ICD-10-CM

## 2021-08-08 NOTE — Telephone Encounter (Signed)
Patient is calling in asking if someone can give him a call about his CT results.

## 2021-08-08 NOTE — Telephone Encounter (Signed)
Notified pt as soon as Dr. Darnell Level receives and reviews the report, we will contact him to relay the results.  Pt verbalizes understanding.

## 2021-08-12 NOTE — Addendum Note (Signed)
Addended by: Ria Bush on: 08/12/2021 05:37 PM   Modules accepted: Orders

## 2021-08-12 NOTE — Telephone Encounter (Addendum)
See mychart message. I spoke with patient. Had another episode of vertigo on Thursday. Will refer to neurology for further evaluation of ongoing dizziness.

## 2021-08-16 ENCOUNTER — Telehealth: Payer: Self-pay

## 2021-08-16 NOTE — Telephone Encounter (Signed)
Patient came to the office and picked the Ozempic up. Patient stated that is wife is an Therapist, sports and should be able to help him with the medication. Patient will call the office and schedule an appointment with pharmacist if he needs help.

## 2021-08-16 NOTE — Telephone Encounter (Signed)
Received pt's Logan Tanner 0.25, 0.5 mg dose shipment (5 bxs total) today.  Spoke with pt notifying him of medication delivery.  States he will pick up med on Mon, 08/19/21 and needs Mendel Ryder- pharmacist to show him how to administer it.  [Placed Logan Tanner (5 bxs total) in 3rd refrigerator, 4th shelf.]

## 2021-08-30 ENCOUNTER — Encounter: Payer: Self-pay | Admitting: Neurology

## 2021-08-30 ENCOUNTER — Ambulatory Visit (INDEPENDENT_AMBULATORY_CARE_PROVIDER_SITE_OTHER): Payer: Self-pay | Admitting: Neurology

## 2021-08-30 VITALS — BP 131/76 | HR 86 | Wt 199.0 lb

## 2021-08-30 DIAGNOSIS — R42 Dizziness and giddiness: Secondary | ICD-10-CM

## 2021-08-30 DIAGNOSIS — R2689 Other abnormalities of gait and mobility: Secondary | ICD-10-CM
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
## 2021-08-30 NOTE — Patient Instructions (Addendum)
Continue Dramamine as needed Follow-up with your PCP Return if worse or any other concerns

## 2021-09-04 ENCOUNTER — Telehealth: Payer: Self-pay | Admitting: Family Medicine

## 2021-09-04 NOTE — Telephone Encounter (Signed)
If ozempic 0.'5mg'$  is causing nausea/constipation, agree with dropping to 0.'25mg'$  weekly.  May retry 0.'5mg'$  dose once nausea/constipation is resolved.  Ok to stay off glipizide, ok to change timing of actos.

## 2021-09-04 NOTE — Telephone Encounter (Signed)
Rtn pt's call.  States he didn't realize he was to start Ozempic inj at 0.25 mg wkly for 4 wks and has been taking 0.5 mg for last 3 wks. C/o nausea and constipation. Asking if he should decrease to 0.25 mg.  If so, for how long before trying 0.5 mg again. Takes Ozempic on Sundays. Plz advise.  Also, pt wants to make Dr. Darnell Level aware he stopped glipizide due to BS dropping.  Then changed taking Actos from lunchtime to taking it with dinner.

## 2021-09-04 NOTE — Telephone Encounter (Signed)
Spoke with pt relaying Dr. G's message.  Verbalizes understanding.  

## 2021-09-04 NOTE — Telephone Encounter (Signed)
Patient called and stated he is on ozempic and has a couple of questions to ask about the medication. Call back number [phone].

## 2021-11-08 ENCOUNTER — Telehealth: Payer: Self-pay

## 2021-11-08 NOTE — Telephone Encounter (Addendum)
Received pt's Ozempic 0.25, 0.5 mg dose shipment (5 bxs total) today.   Spoke with pt notifying him of medication delivery. Verbalizes understanding.    [Placed Ozempic (5 bxs total) in 2nd refrigerator, 5th shelf.]

## 2022-01-06 ENCOUNTER — Ambulatory Visit (INDEPENDENT_AMBULATORY_CARE_PROVIDER_SITE_OTHER): Payer: Self-pay | Admitting: Family Medicine

## 2022-01-06 ENCOUNTER — Encounter: Payer: Self-pay | Admitting: Family Medicine

## 2022-01-06 VITALS — BP 118/68 | HR 86 | Temp 97.9°F | Ht 66.75 in | Wt 189.2 lb

## 2022-01-06 DIAGNOSIS — E1169 Type 2 diabetes mellitus with other specified complication: Secondary | ICD-10-CM

## 2022-01-06 DIAGNOSIS — E663 Overweight: Secondary | ICD-10-CM

## 2022-01-06 LAB — POCT GLYCOSYLATED HEMOGLOBIN (HGB A1C): Hemoglobin A1C: 6.4 % — AB (ref 4.0–5.6)

## 2022-01-06 MED ORDER — CYANOCOBALAMIN 500 MCG PO TABS: 500.0000 ug | ORAL_TABLET | Freq: Every day | ORAL | Status: DC

## 2022-01-06 MED ORDER — OZEMPIC (0.25 OR 0.5 MG/DOSE) 2 MG/3ML ~~LOC~~ SOPN: 0.5000 mg | PEN_INJECTOR | SUBCUTANEOUS | Status: DC

## 2022-01-06 NOTE — Assessment & Plan Note (Addendum)
Chronic, improved control - congratulated. Continue current regimen including actos, metformin, ozempic. Discussed med related constipation management.

## 2022-01-06 NOTE — Patient Instructions (Addendum)
We will request latest diabetic eye exam from Dr Gloriann Loan in [place].  You are doing well today - continue current medicines Return in 6 months for physical.

## 2022-01-06 NOTE — Progress Notes (Signed)
Patient ID: Logan Tanner, male    DOB: [birth year], 66 y.o.   MRN: [id number]  This visit was conducted in person.  BP 118/68 (BP Location: Left Arm, Patient Position: Sitting)   Pulse 86   Temp 97.9 F (36.6 C) (Skin)   Ht 5' 6.75" (1.695 m)   Wt 189 lb 4 oz (85.8 kg)   SpO2 98%   BMI 29.86 kg/m    CC: DM f/u visit  Subjective:   HPI: Logan Tanner is a 66 y.o. male presenting on 01/06/2022 for Follow-up (diabetes)   No further dizziness episodes.   DM - does regularly check sugars 100-110 random. Compliant with antihyperglycemic regimen which includes: metformin '1000mg'$  bid, actos '30mg'$  daily, ozempic 0.'5mg'$  weekly - initial nausea, now better. Notes some constipation managed with dulcolax. Denies low sugars or hypoglycemic symptoms. Denies paresthesias, blurry vision. Last diabetic eye exam ~2022 (Dr Logan Tanner in [place]. Glucometer brand: [employer] Next. Last foot exam: 03/2020. DSME: declined. Lab Results  Component Value Date   HGBA1C 6.4 (A) 01/06/2022   Diabetic Foot Exam - Simple   Simple Foot Form Diabetic Foot exam was performed with the following findings: Yes 01/06/2022  8:19 AM  Visual Inspection No deformities, no ulcerations, no other skin breakdown bilaterally: Yes Sensation Testing Intact to touch and monofilament testing bilaterally: Yes Pulse Check Posterior Tibialis and Dorsalis pulse intact bilaterally: Yes Comments    Lab Results  Component Value Date   MICROALBUR 3.0 (H) 07/05/2021         Relevant past medical, surgical, family and social history reviewed and updated as indicated. Interim medical history since our last visit reviewed. Allergies and medications reviewed and updated. Outpatient Medications Prior to Visit  Medication Sig Dispense Refill   Coenzyme Q10 (CO Q 10) 100 MG CAPS Take 1 capsule by mouth daily. 30 capsule    meclizine (ANTIVERT) 25 MG tablet Take 1 tablet (25 mg total) by mouth 3 (three) times daily as needed  for dizziness (sedating). 30 tablet 0   metFORMIN (GLUCOPHAGE) 1000 MG tablet Take 1 tablet (1,000 mg total) by mouth 2 (two) times daily with a meal. 180 tablet 3   naproxen sodium (ANAPROX) 220 MG tablet Take 220 mg by mouth as needed.     omeprazole (PRILOSEC) 40 MG capsule Take 1 capsule (40 mg total) by mouth daily as needed. 90 capsule 1   pioglitazone (ACTOS) 30 MG tablet Take 1 tablet (30 mg total) by mouth daily. 90 tablet 3   simvastatin (ZOCOR) 20 MG tablet Take 1 tablet (20 mg total) by mouth every other day. 45 tablet 3   Semaglutide,0.25 or 0.'5MG'$ /DOS, (OZEMPIC, 0.25 OR 0.5 MG/DOSE,) 2 MG/3ML SOPN Inject 0.25 mg into the skin once weekly for 4 weeks, then increase to 0.5 mg weekly as tolerated. Via [employer] PAP. 3 mL 4   vitamin B-12 (CYANOCOBALAMIN) 1000 MCG tablet Take 1 tablet (1,000 mcg total) by mouth every Monday, Wednesday, and Friday.     No facility-administered medications prior to visit.     Per HPI unless specifically indicated in ROS section below Review of Systems  Objective:  BP 118/68 (BP Location: Left Arm, Patient Position: Sitting)   Pulse 86   Temp 97.9 F (36.6 C) (Skin)   Ht 5' 6.75" (1.695 m)   Wt 189 lb 4 oz (85.8 kg)   SpO2 98%   BMI 29.86 kg/m   Wt Readings from Last 3 Encounters:  01/06/22 189 lb  4 oz (85.8 kg)  08/30/21 199 lb (90.3 kg)  08/05/21 212 lb 2 oz (96.2 kg)      Physical Exam Vitals and nursing note reviewed.  Constitutional:      Appearance: Normal appearance. He is not ill-appearing.  Eyes:     Extraocular Movements: Extraocular movements intact.     Conjunctiva/sclera: Conjunctivae normal.     Pupils: Pupils are equal, round, and reactive to light.  Cardiovascular:     Rate and Rhythm: Normal rate and regular rhythm.     Pulses: Normal pulses.     Heart sounds: Normal heart sounds. No murmur heard. Pulmonary:     Effort: Pulmonary effort is normal. No respiratory distress.     Breath sounds: Normal breath sounds. No  wheezing, rhonchi or rales.  Musculoskeletal:     Right lower leg: No edema.     Left lower leg: No edema.     Comments: See HPI for foot exam if done  Skin:    General: Skin is warm and dry.     Findings: No rash.  Neurological:     Mental Status: He is alert.  Psychiatric:        Mood and Affect: Mood normal.        Behavior: Behavior normal.       Results for orders placed or performed in visit on 01/06/22  POCT glycosylated hemoglobin (Hb A1C)  Result Value Ref Range   Hemoglobin A1C 6.4 (A) 4.0 - 5.6 %   HbA1c POC (<> result, manual entry)     HbA1c, POC (prediabetic range)     HbA1c, POC (controlled diabetic range)      Assessment & Plan:   Problem List Items Addressed This Visit       Unprioritized   Type 2 diabetes mellitus with other specified complication ([place]) - Primary    Chronic, improved control - congratulated. Continue current regimen including actos, metformin, ozempic. Discussed med related constipation management.       Relevant Medications   Semaglutide,0.25 or 0.'5MG'$ /DOS, (OZEMPIC, 0.25 OR 0.5 MG/DOSE,) 2 MG/3ML SOPN   Other Relevant Orders   POCT glycosylated hemoglobin (Hb A1C) (Completed)   Overweight (BMI 25.0-29.9)    Congratulated on weight loss to date. BMI now <30.         Meds ordered this encounter  Medications   Semaglutide,0.25 or 0.'5MG'$ /DOS, (OZEMPIC, 0.25 OR 0.5 MG/DOSE,) 2 MG/3ML SOPN    Sig: Inject 0.5 mg as directed once a week. Via [employer] PAP.   cyanocobalamin (V-R VITAMIN B-12) 500 MCG tablet    Sig: Take 1 tablet (500 mcg total) by mouth daily.   Orders Placed This Encounter  Procedures   POCT glycosylated hemoglobin (Hb A1C)     Patient Instructions  We will request latest diabetic eye exam from Dr Logan Tanner in [place].  You are doing well today - continue current medicines Return in 6 months for physical.   Follow up plan: Return in about 6 months (around 07/07/2022) for annual exam, prior fasting for blood  work.  Logan Bush, MD

## 2022-01-06 NOTE — Assessment & Plan Note (Signed)
Congratulated on weight loss to date. BMI now <30.

## 2022-01-30 ENCOUNTER — Telehealth: Payer: Self-pay

## 2022-01-30 NOTE — Telephone Encounter (Signed)
Received pt's Ozempic 0.25, 0.5 mg dose shipment (5 bxs total) today.   Attempted to contact pt. No answer, vm box full. Need to notify pt of above info.     [Placed Ozempic (5 bxs total) in 2nd refrigerator, 2nd shelf.]

## 2022-01-30 NOTE — Telephone Encounter (Addendum)
Per Aaron Edelman, pt called back and was informed about med shipment.

## 2022-02-05 ENCOUNTER — Encounter: Payer: Self-pay | Admitting: Internal Medicine

## 2022-02-21 ENCOUNTER — Ambulatory Visit (INDEPENDENT_AMBULATORY_CARE_PROVIDER_SITE_OTHER): Payer: Self-pay | Admitting: Family Medicine

## 2022-02-21 ENCOUNTER — Encounter: Payer: Self-pay | Admitting: Family Medicine

## 2022-02-21 VITALS — BP 136/70 | HR 100 | Temp 97.7°F | Ht 66.75 in | Wt 187.2 lb

## 2022-02-21 DIAGNOSIS — M791 Myalgia, unspecified site: Secondary | ICD-10-CM

## 2022-02-21 DIAGNOSIS — R051 Acute cough: Secondary | ICD-10-CM

## 2022-02-21 DIAGNOSIS — E1169 Type 2 diabetes mellitus with other specified complication: Secondary | ICD-10-CM

## 2022-02-21 DIAGNOSIS — R6883 Chills (without fever): Secondary | ICD-10-CM

## 2022-02-21 DIAGNOSIS — F17210 Nicotine dependence, cigarettes, uncomplicated: Secondary | ICD-10-CM

## 2022-02-21 DIAGNOSIS — J111 Influenza due to unidentified influenza virus with other respiratory manifestations: Secondary | ICD-10-CM | POA: Insufficient documentation

## 2022-02-21 DIAGNOSIS — F17200 Nicotine dependence, unspecified, uncomplicated: Secondary | ICD-10-CM

## 2022-02-21 MED ORDER — OSELTAMIVIR PHOSPHATE 75 MG PO CAPS: 75.0000 mg | ORAL_CAPSULE | Freq: Two times a day (BID) | ORAL | 0 refills | Status: DC

## 2022-02-21 NOTE — Assessment & Plan Note (Addendum)
We are out of flu swabs. Given symptoms and flu exposure, anticipate influenza. Given comorbidities ie diabetic smoker, recommend tamiflu treatment - sent to pharmacy. Reviewed possible side effects of treatment.  Update if not improving as expected or if fever >101 or worsening productive cough.  I did ask him to check COVID swab at home and let us know if positive.

## 2022-02-21 NOTE — Patient Instructions (Signed)
Check COVID test at home.  Given flu symptoms and flu exposure, reasonable to treat for influenza.  Start tamiflu '75mg'$  twice daily for 5 days.  Push fluids and rest.  Let us know if COVID test returns positive or if worsening symptoms despite treatment.

## 2022-02-21 NOTE — Progress Notes (Signed)
Patient ID: Logan Tanner, male    DOB: 1955/04/30, 67 y.o.   MRN: [id number]  This visit was conducted in person.  BP 136/70   Pulse 100   Temp 97.7 F (36.5 C) (Temporal)   Ht 5' 6.75" (1.695 m)   Wt 187 lb 4 oz (84.9 kg)   SpO2 94%   BMI 29.55 kg/m    CC: cough, body aches Subjective:   HPI: Logan Tanner is a 67 y.o. male presenting on 02/21/2022 for Cough (C/o cough, body aches and chills. Started 02/19/22. Exposed to flu. )   2d h/o cough of clear mucous, body aches, chills, some constipation. Joint pains.   No fevers, nausea, vomiting, loss of appetite, ST, ear or tooth pain, sinus pressure headache.   Exposed to flu at home (son).   No known COVID test.  Continued 1ppd smoker.  Out of work today.   He's been taking OTC immune health support as well as cough suppressant, corisidin, nyquil.   Lab Results  Component Value Date   HGBA1C 6.4 (A) 01/06/2022        Relevant past medical, surgical, family and social history reviewed and updated as indicated. Interim medical history since our last visit reviewed. Allergies and medications reviewed and updated. Outpatient Medications Prior to Visit  Medication Sig Dispense Refill   Coenzyme Q10 (CO Q 10) 100 MG CAPS Take 1 capsule by mouth daily. 30 capsule    cyanocobalamin (V-R VITAMIN B-12) 500 MCG tablet Take 1 tablet (500 mcg total) by mouth daily.     meclizine (ANTIVERT) 25 MG tablet Take 1 tablet (25 mg total) by mouth 3 (three) times daily as needed for dizziness (sedating). 30 tablet 0   metFORMIN (GLUCOPHAGE) 1000 MG tablet Take 1 tablet (1,000 mg total) by mouth 2 (two) times daily with a meal. 180 tablet 3   naproxen sodium (ANAPROX) 220 MG tablet Take 220 mg by mouth as needed.     omeprazole (PRILOSEC) 40 MG capsule Take 1 capsule (40 mg total) by mouth daily as needed. 90 capsule 1   pioglitazone (ACTOS) 30 MG tablet Take 1 tablet (30 mg total) by mouth daily. 90 tablet 3   Semaglutide,0.25 or  0.'5MG'$ /DOS, (OZEMPIC, 0.25 OR 0.5 MG/DOSE,) 2 MG/3ML SOPN Inject 0.5 mg as directed once a week. Via [employer] PAP.     simvastatin (ZOCOR) 20 MG tablet Take 1 tablet (20 mg total) by mouth every other day. 45 tablet 3   No facility-administered medications prior to visit.     Per HPI unless specifically indicated in ROS section below Review of Systems  Objective:  BP 136/70   Pulse 100   Temp 97.7 F (36.5 C) (Temporal)   Ht 5' 6.75" (1.695 m)   Wt 187 lb 4 oz (84.9 kg)   SpO2 94%   BMI 29.55 kg/m   Wt Readings from Last 3 Encounters:  02/21/22 187 lb 4 oz (84.9 kg)  01/06/22 189 lb 4 oz (85.8 kg)  08/30/21 199 lb (90.3 kg)      Physical Exam Vitals and nursing note reviewed.  Constitutional:      Appearance: Normal appearance. He is not ill-appearing.  HENT:     Head: Normocephalic and atraumatic.     Right Ear: Hearing, tympanic membrane, ear canal and external ear normal. There is no impacted cerumen.     Left Ear: Hearing, tympanic membrane, ear canal and external ear normal. There is no impacted cerumen.  Nose: No mucosal edema.     Right Turbinates: Not enlarged or swollen.     Left Turbinates: Not enlarged or swollen.     Right Sinus: No maxillary sinus tenderness or frontal sinus tenderness.     Left Sinus: No maxillary sinus tenderness or frontal sinus tenderness.     Mouth/Throat:     Comments: Wearing mask Eyes:     Extraocular Movements: Extraocular movements intact.     Conjunctiva/sclera: Conjunctivae normal.     Pupils: Pupils are equal, round, and reactive to light.  Cardiovascular:     Rate and Rhythm: Normal rate and regular rhythm.     Pulses: Normal pulses.     Heart sounds: Normal heart sounds. No murmur heard. Pulmonary:     Effort: Pulmonary effort is normal. No respiratory distress.     Breath sounds: Normal breath sounds. No wheezing, rhonchi or rales.  Musculoskeletal:     Cervical back: Normal range of motion and neck supple. No  rigidity.     Right lower leg: No edema.     Left lower leg: No edema.  Lymphadenopathy:     Cervical: No cervical adenopathy.  Skin:    General: Skin is warm and dry.     Findings: No rash.  Neurological:     Mental Status: He is alert.  Psychiatric:        Mood and Affect: Mood normal.        Behavior: Behavior normal.       Results for orders placed or performed in visit on 01/06/22  POCT glycosylated hemoglobin (Hb A1C)  Result Value Ref Range   Hemoglobin A1C 6.4 (A) 4.0 - 5.6 %   HbA1c POC (<> result, manual entry)     HbA1c, POC (prediabetic range)     HbA1c, POC (controlled diabetic range)      Assessment & Plan:   Problem List Items Addressed This Visit     Type 2 diabetes mellitus with other specified complication ([place])   Smoker   Influenza - Primary    We are out of flu swabs. Given symptoms and flu exposure, anticipate influenza. Given comorbidities ie diabetic smoker, recommend tamiflu treatment - sent to pharmacy. Reviewed possible side effects of treatment.  Update if not improving as expected or if fever >101 or worsening productive cough.  I did ask him to check COVID swab at home and let us know if positive.       Relevant Medications   oseltamivir (TAMIFLU) 75 MG capsule     Meds ordered this encounter  Medications   oseltamivir (TAMIFLU) 75 MG capsule    Sig: Take 1 capsule (75 mg total) by mouth 2 (two) times daily.    Dispense:  10 capsule    Refill:  0    No orders of the defined types were placed in this encounter.   Patient Instructions  Check COVID test at home.  Given flu symptoms and flu exposure, reasonable to treat for influenza.  Start tamiflu '75mg'$  twice daily for 5 days.  Push fluids and rest.  Let us know if COVID test returns positive or if worsening symptoms despite treatment.   Follow up plan: Return if symptoms worsen or fail to improve.  Ria Bush, MD

## 2022-03-13 NOTE — Telephone Encounter (Signed)
Patient called back and was returning a call.

## 2022-03-13 NOTE — Telephone Encounter (Signed)
Spoke with pt reminding him we have an Ozempic shipment for him. States he will come by one day this week to pick it up.

## 2022-03-13 NOTE — Telephone Encounter (Signed)
Pt has not picked up med shipment.  Attempted to contact pt. No answer, vm box full. Need to remind pt to pick up Ozempic shipment from our office.

## 2022-07-09 ENCOUNTER — Other Ambulatory Visit: Payer: Self-pay | Admitting: Family Medicine

## 2022-07-09 DIAGNOSIS — E1169 Type 2 diabetes mellitus with other specified complication: Secondary | ICD-10-CM

## 2022-07-09 NOTE — Telephone Encounter (Signed)
E-scribed refills.  Plz schedule CPE and fasting lab visits for additional refills.  

## 2022-08-18 ENCOUNTER — Other Ambulatory Visit: Payer: Self-pay | Admitting: Family Medicine

## 2022-08-18 DIAGNOSIS — E1169 Type 2 diabetes mellitus with other specified complication: Secondary | ICD-10-CM

## 2022-09-24 ENCOUNTER — Telehealth: Payer: Self-pay | Admitting: Family Medicine

## 2022-09-24 NOTE — Telephone Encounter (Signed)
Prescription Request  09/24/2022  LOV: 02/21/2022  What is the name of the medication or equipment? Semaglutide,0.25 or 0.5MG /DOS, (OZEMPIC, 0.25 OR 0.5 MG/DOSE,) 2 MG/3ML SOPN [[id number]]   Have you contacted your pharmacy to request a refill? No   Which pharmacy would you like this sent to?  Pt states he picks up meds from our office.     Patient notified that their request is being sent to the clinical staff for review and that they should receive a response within 2 business days.   Please advise at Mobile [phone] (mobile)   Pt also asked did he need to bring his tax records & other info to our office again, like he;s done in the past, for med refill?

## 2022-09-24 NOTE — Telephone Encounter (Signed)
Is it time to renew pt's PAP app with [employer] for [employer]? Plz advise.

## 2022-09-25 NOTE — Telephone Encounter (Signed)
It shouldn't be yet. Please inform patient to contact [employer] to follow up on the status of his shipment - [phone]. They are available Monday though Friday 8:00 AM - 8:00 PM.   We will outreach him to discuss re-enrollment in November/December.   Catie Eppie Gibson, PharmD, BCACP, CPP Clinical Pharmacist [facility] [phone]

## 2022-09-25 NOTE — Telephone Encounter (Signed)
Noted  

## 2022-09-25 NOTE — Telephone Encounter (Signed)
Patient called back, states he contacted [employer] and was told the application for this medication ran out in June 2024. Patient was told they would be faxing a new application to [place] to be completed. Please be advised of this in Dr. Timoteo [address]

## 2022-09-25 NOTE — Telephone Encounter (Signed)
Noted.   Spoke with pt relaying Catherine's message and provided phn # for [employer] for him to contact them directly. Pt verbalizes understanding and says he'll see what he can find out.

## 2022-10-03 NOTE — Telephone Encounter (Signed)
Patient dropped off tax documentation copies for [employer] for to be r filled out. Patient was wondering if assistance form has been received by [place] yet? Please advise patient if needed. Documents placed in provider's tray in front office

## 2022-10-06 NOTE — Telephone Encounter (Signed)
Catie we have the tax documents.  Where do I need to send them?

## 2022-10-07 NOTE — Telephone Encounter (Signed)
Tax documents faxed to number provided.

## 2022-10-07 NOTE — Telephone Encounter (Signed)
Fax to [employer] at [phone]. Team - can we start [employer] app for this patient for Ozempic 0.5 mg?  Catie

## 2022-10-08 ENCOUNTER — Telehealth: Payer: Self-pay

## 2022-10-08 ENCOUNTER — Other Ambulatory Visit (HOSPITAL_COMMUNITY): Payer: Self-pay

## 2022-10-08 NOTE — Telephone Encounter (Signed)
PAP: Application for Ozempic has been submitted to PAP Companies: [employer], online   PLEASE BE ADVISED E-FILED... IF THEY REQUEST INCOME I HAVE A COPY OF HIS INCOME.

## 2022-10-17 ENCOUNTER — Other Ambulatory Visit: Payer: Self-pay | Admitting: Family Medicine

## 2022-10-17 DIAGNOSIS — E1169 Type 2 diabetes mellitus with other specified complication: Secondary | ICD-10-CM

## 2022-10-20 NOTE — Telephone Encounter (Signed)
PAP: Patient assistance application for Ozempic has been approved by PAP Companies: [employer] from 10/10/2022 to 10/04/2023. Medication should be delivered to PAP Delivery: Provider's office For further shipping updates, please contact [employer] at [phone] Pt ID is: [id number]    PLEASE BE ADVISED LETTER OF APPROVAL HAS BEEN SCANNED INTO MEDIA OF CHART

## 2022-10-24 ENCOUNTER — Telehealth: Payer: Self-pay

## 2022-10-24 NOTE — Telephone Encounter (Signed)
Received pt's Ozempic 0.25, 0.5 mg dose shipment (5 bxs total) today.   Spoke with pt notifying him of medication delivery.  Verbalizes understanding.    [Placed Ozempic (5 bxs total) in 2nd refrigerator, 3rd shelf.]

## 2022-10-28 NOTE — Telephone Encounter (Signed)
Patient picked up medication

## 2022-11-13 ENCOUNTER — Encounter: Payer: Self-pay | Admitting: Family Medicine

## 2022-11-13 NOTE — Assessment & Plan Note (Signed)
Preventative protocols reviewed and updated unless pt declined. Discussed healthy diet and lifestyle.  

## 2022-11-13 NOTE — Progress Notes (Signed)
Ph: [phone] Fax: [phone]   Patient ID: Logan Tanner, male    DOB: 09-03-1955, 67 y.o.   MRN: [id number]  This visit was conducted in person.  BP 134/76   Pulse 77   Temp 98.1 F (36.7 C) (Oral)   Ht 5' 5.5" (1.664 m)   Wt 181 lb 2 oz (82.2 kg)   SpO2 99%   BMI 29.68 kg/m    CC: CPE Subjective:   HPI: Logan Tanner is a 67 y.o. male presenting on 11/14/2022 for Annual Exam   Planning to get Medicare next month.  Saw audiologist, noted tinnitus.  Has CDL   DM - continues metformin 1000mg  bid and actos 30mg  daily with ozempic 0.5mg  weekly via [employer] PAP. Notes ozempic causes constipation - 1 BM every few days.   R sciatica started bothering him for the past 1.5 months. Treating with home stretches and aleve. Requests Rx for muscle relaxer.  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   Preventative: COLONOSCOPY Date: 01/2012 2 diminutive polyps, mod diverticulosis, rec rpt 10 yrs [employer]) - waiting for Medicare  Prostate screening - continue screening with yearly PSA  Lung cancer screening - eligible, but desires to postpone for now due to cost.  Flu shot - does not receive  COVID vaccine J&J 08/2019, J&J booster 01/2020  Pneumovax 02/2011  Td - 1997, 02/2010 Shingrix - discussed, interested. H/o shingles.  Seat belt use discussed  Sunscreen use discussed. No changing moles on skin.  Smoking - 1 ppd. 30+ PY hx. Wife also smokes.  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 Nickola Major ([employer])  Activity: no regular exercise, stays active on the job.  Diet: good water, fruits/vegetables daily      Relevant past  medical, surgical, family and social history reviewed and updated as indicated. Interim medical history since our last visit reviewed. Allergies and medications reviewed and updated. Outpatient Medications Prior to Visit  Medication Sig Dispense Refill   Coenzyme Q10 (CO Q 10) 100 MG CAPS Take 1 capsule by mouth daily. 30 capsule    naproxen sodium (ANAPROX) 220 MG tablet Take 220 mg by mouth as needed.     Semaglutide,0.25 or 0.5MG /DOS, (OZEMPIC, 0.25 OR 0.5 MG/DOSE,) 2 MG/3ML SOPN Inject 0.5 mg as directed once a week. Via [employer] PAP.     cyanocobalamin (V-R VITAMIN B-12) 500 MCG tablet Take 1 tablet (500 mcg total) by mouth daily.     meclizine (ANTIVERT) 25 MG tablet Take 1 tablet (25 mg total) by mouth 3 (three) times daily as needed for dizziness (sedating). 30 tablet 0   metFORMIN (GLUCOPHAGE) 1000 MG tablet TAKE 1 TABLET BY MOUTH TWICE DAILY WITH A MEAL 180 tablet 0   omeprazole (PRILOSEC) 40 MG capsule Take 1 capsule (40 mg total) by mouth daily as needed. 90 capsule 1   pioglitazone (ACTOS) 30 MG tablet TAKE 1 TABLET BY MOUTH ONCE DAILY 90 tablet 0   simvastatin (ZOCOR) 20 MG tablet TAKE 1 TABLET BY MOUTH EVERY OTHER DAY 45 tablet 0   oseltamivir (TAMIFLU) 75 MG capsule Take 1 capsule (75 mg total) by mouth 2 (two)  times daily. 10 capsule 0   No facility-administered medications prior to visit.     Per HPI unless specifically indicated in ROS section below Review of Systems  Constitutional:  Negative for activity change, appetite change, chills, fatigue, fever and unexpected weight change.  HENT:  Negative for hearing loss.   Eyes:  Negative for visual disturbance.  Respiratory:  Negative for cough, chest tightness, shortness of breath and wheezing.   Cardiovascular:  Negative for chest pain, palpitations and leg swelling.  Gastrointestinal:  Negative for abdominal distention, abdominal pain, blood in stool, constipation, diarrhea, nausea and vomiting.  Genitourinary:   Negative for difficulty urinating and hematuria.  Musculoskeletal:  Negative for arthralgias, myalgias and neck pain.  Skin:  Negative for rash.  Neurological:  Negative for dizziness, seizures, syncope and headaches.  Hematological:  Negative for adenopathy. Does not bruise/bleed easily.  Psychiatric/Behavioral:  Negative for dysphoric mood. The patient is not nervous/anxious.     Objective:  BP 134/76   Pulse 77   Temp 98.1 F (36.7 C) (Oral)   Ht 5' 5.5" (1.664 m)   Wt 181 lb 2 oz (82.2 kg)   SpO2 99%   BMI 29.68 kg/m   Wt Readings from Last 3 Encounters:  11/14/22 181 lb 2 oz (82.2 kg)  02/21/22 187 lb 4 oz (84.9 kg)  01/06/22 189 lb 4 oz (85.8 kg)      Physical Exam Vitals and nursing note reviewed.  Constitutional:      General: He is not in acute distress.    Appearance: Normal appearance. He is well-developed. He is not ill-appearing.  HENT:     Head: Normocephalic and atraumatic.     Right Ear: Hearing, tympanic membrane, ear canal and external ear normal. There is impacted cerumen.     Left Ear: Hearing, tympanic membrane, ear canal and external ear normal. There is impacted cerumen.     Mouth/Throat:     Mouth: Mucous membranes are moist.     Pharynx: Oropharynx is clear. No oropharyngeal exudate or posterior oropharyngeal erythema.  Eyes:     General: No scleral icterus.    Extraocular Movements: Extraocular movements intact.     Conjunctiva/sclera: Conjunctivae normal.     Pupils: Pupils are equal, round, and reactive to light.  Neck:     Thyroid: No thyroid mass or thyromegaly.     Vascular: No carotid bruit.  Cardiovascular:     Rate and Rhythm: Normal rate and regular rhythm.     Pulses: Normal pulses.          Radial pulses are 2+ on the right side and 2+ on the left side.     Heart sounds: Normal heart sounds. No murmur heard. Pulmonary:     Effort: Pulmonary effort is normal. No respiratory distress.     Breath sounds: Normal breath sounds. No  wheezing, rhonchi or rales.  Abdominal:     General: Bowel sounds are normal. There is no distension.     Palpations: Abdomen is soft. There is no mass.     Tenderness: There is no abdominal tenderness. There is no guarding or rebound.     Hernia: No hernia is present.  Musculoskeletal:        General: Normal range of motion.     Cervical back: Normal range of motion and neck supple.     Right lower leg: No edema.     Left lower leg: No edema.  Lymphadenopathy:     Cervical: No  cervical adenopathy.  Skin:    General: Skin is warm and dry.     Findings: No rash.  Neurological:     General: No focal deficit present.     Mental Status: He is alert and oriented to person, place, and time.  Psychiatric:        Mood and Affect: Mood normal.        Behavior: Behavior normal.        Thought Content: Thought content normal.        Judgment: Judgment normal.       Results for orders placed or performed in visit on 01/06/22  POCT glycosylated hemoglobin (Hb A1C)  Result Value Ref Range   Hemoglobin A1C 6.4 (A) 4.0 - 5.6 %   HbA1c POC (<> result, manual entry)     HbA1c, POC (prediabetic range)     HbA1c, POC (controlled diabetic range)      Assessment & Plan:   Problem List Items Addressed This Visit     Healthcare maintenance - Primary (Chronic)    Preventative protocols reviewed and updated unless pt declined. Discussed healthy diet and lifestyle.       Type 2 diabetes mellitus with other specified complication ([facility])    Chronic, stable on current regimen - update labs      Relevant Medications   metFORMIN (GLUCOPHAGE) 1000 MG tablet   pioglitazone (ACTOS) 30 MG tablet   simvastatin (ZOCOR) 20 MG tablet   Other Relevant Orders   Hemoglobin A1c   Microalbumin / creatinine urine ratio   Vitamin B12   Urinalysis, Routine w reflex microscopic   Hyperlipidemia associated with type 2 diabetes mellitus (HCC)    Chronic, continue simvastatin, update FLP. The 10-year ASCVD  risk score (Arnett DK, et al., 2019) is: 25.9%   Values used to calculate the score:     Age: 71 years     Sex: Male     Is Non-Hispanic African American: No     Diabetic: Yes     Tobacco smoker: Yes     Systolic Blood Pressure: 134 mmHg     Is BP treated: No     HDL Cholesterol: 55.9 mg/dL     Total Cholesterol: 160 mg/dL       Relevant Medications   metFORMIN (GLUCOPHAGE) 1000 MG tablet   pioglitazone (ACTOS) 30 MG tablet   simvastatin (ZOCOR) 20 MG tablet   Other Relevant Orders   Lipid panel   Comprehensive metabolic panel   Smoker    Continue to encourage full cessation. Continues 1 ppd Will refer to lung cancer screening program.       Relevant Orders   Ambulatory referral to Gastroenterology   Ambulatory Referral for Lung Cancer Scre   Chronic low back pain    Continue aleve PRN.  Rx robaxin PRN, with sedation precautions.       Relevant Medications   methocarbamol (ROBAXIN) 500 MG tablet   Benign prostatic hyperplasia    Update PSA      Relevant Orders   PSA   GERD (gastroesophageal reflux disease)    Chronic, stable period on PRN omeprazole       Relevant Medications   meclizine (ANTIVERT) 25 MG tablet   omeprazole (PRILOSEC) 40 MG capsule   Dizziness    Continue PRN meclizine with benefit.       Relevant Medications   meclizine (ANTIVERT) 25 MG tablet   Other Visit Diagnoses     Special screening for malignant  neoplasms, colon       Relevant Orders   Ambulatory referral to Gastroenterology        Meds ordered this encounter  Medications   meclizine (ANTIVERT) 25 MG tablet    Sig: Take 1 tablet (25 mg total) by mouth 2 (two) times daily as needed for dizziness (sedating).    Dispense:  30 tablet    Refill:  3   metFORMIN (GLUCOPHAGE) 1000 MG tablet    Sig: Take 1 tablet (1,000 mg total) by mouth 2 (two) times daily with a meal.    Dispense:  180 tablet    Refill:  4   omeprazole (PRILOSEC) 40 MG capsule    Sig: Take 1 capsule (40 mg  total) by mouth daily as needed.    Dispense:  90 capsule    Refill:  4   pioglitazone (ACTOS) 30 MG tablet    Sig: Take 1 tablet (30 mg total) by mouth daily.    Dispense:  90 tablet    Refill:  4   simvastatin (ZOCOR) 20 MG tablet    Sig: Take 1 tablet (20 mg total) by mouth every other day.    Dispense:  45 tablet    Refill:  4   cyanocobalamin (VITAMIN B12) 1000 MCG tablet    Sig: Take 1 tablet (1,000 mcg total) by mouth daily.   methocarbamol (ROBAXIN) 500 MG tablet    Sig: Take 1 tablet (500 mg total) by mouth every 8 (eight) hours as needed for muscle spasms (sedation precautions).    Dispense:  40 tablet    Refill:  1    Orders Placed This Encounter  Procedures   Hemoglobin A1c   Microalbumin / creatinine urine ratio   Lipid panel   Comprehensive metabolic panel   PSA   Vitamin B12   Urinalysis, Routine w reflex microscopic   Ambulatory referral to Gastroenterology    Referral Priority:   Routine    Referral Type:   Consultation    Referral Reason:   Specialty Services Required    Number of Visits Requested:   1   Ambulatory Referral for Lung Cancer Scre    Referral Priority:   Routine    Referral Type:   Consultation    Referral Reason:   Specialty Services Required    Number of Visits Requested:   1    Patient Instructions  Labs today.  Consider miralax powder or senna for bowel regimen.  We will sign you up for colonoscopy after December.  I will ask lung cancer screening program to contact you about CT scan.  Schedule appointment with Dr Alvester Morin for diabetic eye exam . Good to see you today Return as needed or in 6 months for diabetes follow up visit   Follow up plan: Return in about 6 months (around 05/15/2023), or if symptoms worsen or fail to improve, for follow up visit.  Eustaquio Boyden, MD

## 2022-11-14 ENCOUNTER — Other Ambulatory Visit: Payer: Self-pay | Admitting: Family Medicine

## 2022-11-14 ENCOUNTER — Encounter: Payer: Self-pay | Admitting: Family Medicine

## 2022-11-14 ENCOUNTER — Ambulatory Visit (INDEPENDENT_AMBULATORY_CARE_PROVIDER_SITE_OTHER): Payer: Self-pay | Admitting: Family Medicine

## 2022-11-14 VITALS — BP 134/76 | HR 77 | Temp 98.1°F | Ht 65.5 in | Wt 181.1 lb

## 2022-11-14 DIAGNOSIS — K219 Gastro-esophageal reflux disease without esophagitis: Secondary | ICD-10-CM

## 2022-11-14 DIAGNOSIS — N401 Enlarged prostate with lower urinary tract symptoms: Secondary | ICD-10-CM

## 2022-11-14 DIAGNOSIS — F17200 Nicotine dependence, unspecified, uncomplicated: Secondary | ICD-10-CM

## 2022-11-14 DIAGNOSIS — E1169 Type 2 diabetes mellitus with other specified complication: Secondary | ICD-10-CM

## 2022-11-14 DIAGNOSIS — Z1211 Encounter for screening for malignant neoplasm of colon: Secondary | ICD-10-CM

## 2022-11-14 DIAGNOSIS — Z0000 Encounter for general adult medical examination without abnormal findings: Secondary | ICD-10-CM

## 2022-11-14 DIAGNOSIS — G8929 Other chronic pain: Secondary | ICD-10-CM

## 2022-11-14 DIAGNOSIS — R351 Nocturia: Secondary | ICD-10-CM

## 2022-11-14 DIAGNOSIS — R42 Dizziness and giddiness: Secondary | ICD-10-CM

## 2022-11-14 DIAGNOSIS — Z7984 Long term (current) use of oral hypoglycemic drugs: Secondary | ICD-10-CM

## 2022-11-14 DIAGNOSIS — M5441 Lumbago with sciatica, right side: Secondary | ICD-10-CM

## 2022-11-14 DIAGNOSIS — E785 Hyperlipidemia, unspecified: Secondary | ICD-10-CM

## 2022-11-14 LAB — COMPREHENSIVE METABOLIC PANEL
ALT: 15 U/L (ref 0–53)
AST: 14 U/L (ref 0–37)
Albumin: 4.1 g/dL (ref 3.5–5.2)
Alkaline Phosphatase: 55 U/L (ref 39–117)
BUN: 12 mg/dL (ref 6–23)
CO2: 29 meq/L (ref 19–32)
Calcium: 9.3 mg/dL (ref 8.4–10.5)
Chloride: 106 meq/L (ref 96–112)
Creatinine, Ser: 0.73 mg/dL (ref 0.40–1.50)
GFR: 95.89 mL/min (ref >60.00)
Glucose, Bld: 131 mg/dL — ABNORMAL HIGH (ref 70–99)
Potassium: 4.7 meq/L (ref 3.5–5.1)
Sodium: 141 meq/L (ref 135–145)
Total Bilirubin: 0.5 mg/dL (ref 0.2–1.2)
Total Protein: 6.4 g/dL (ref 6.0–8.3)

## 2022-11-14 LAB — URINALYSIS, ROUTINE W REFLEX MICROSCOPIC
Bilirubin Urine: NEGATIVE
Hgb urine dipstick: NEGATIVE
Ketones, ur: NEGATIVE
Leukocytes,Ua: NEGATIVE
Nitrite: NEGATIVE
RBC / HPF: NONE SEEN (ref 0–?)
Specific Gravity, Urine: 1.02 (ref 1.000–1.030)
Total Protein, Urine: NEGATIVE
Urine Glucose: NEGATIVE
Urobilinogen, UA: 1 (ref 0.0–1.0)
pH: 6.5 (ref 5.0–8.0)

## 2022-11-14 LAB — LIPID PANEL
Cholesterol: 133 mg/dL (ref 0–200)
HDL: 56.7 mg/dL (ref >39.00)
LDL Cholesterol: 65 mg/dL (ref 0–99)
NonHDL: 76.68
Total CHOL/HDL Ratio: 2
Triglycerides: 58 mg/dL (ref 0.0–149.0)
VLDL: 11.6 mg/dL (ref 0.0–40.0)

## 2022-11-14 LAB — PSA: PSA: 1.44 ng/mL (ref 0.10–4.00)

## 2022-11-14 LAB — HEMOGLOBIN A1C: Hgb A1c MFr Bld: 6.9 % — ABNORMAL HIGH (ref 4.6–6.5)

## 2022-11-14 LAB — MICROALBUMIN / CREATININE URINE RATIO
Creatinine,U: 110.7 mg/dL
Microalb Creat Ratio: 1.4 mg/g (ref 0.0–30.0)
Microalb, Ur: 1.5 mg/dL (ref 0.0–1.9)

## 2022-11-14 LAB — VITAMIN B12: Vitamin B-12: 1116 pg/mL — ABNORMAL HIGH (ref 211–911)

## 2022-11-14 MED ORDER — VITAMIN B-12 1000 MCG PO TABS: 1000.0000 ug | ORAL_TABLET | Freq: Every day | ORAL | Status: DC

## 2022-11-14 MED ORDER — METHOCARBAMOL 500 MG PO TABS: 500.0000 mg | ORAL_TABLET | Freq: Three times a day (TID) | ORAL | 1 refills | Status: DC | PRN

## 2022-11-14 MED ORDER — SIMVASTATIN 20 MG PO TABS
20.0000 mg | ORAL_TABLET | ORAL | 4 refills | Status: DC
Start: 2022-11-14 — End: 2022-11-14

## 2022-11-14 MED ORDER — METFORMIN HCL 1000 MG PO TABS
1000.0000 mg | ORAL_TABLET | Freq: Two times a day (BID) | ORAL | 4 refills | Status: DC
Start: 2022-11-14 — End: 2023-09-15

## 2022-11-14 MED ORDER — MECLIZINE HCL 25 MG PO TABS: 25.0000 mg | ORAL_TABLET | Freq: Two times a day (BID) | ORAL | 3 refills | Status: DC | PRN

## 2022-11-14 MED ORDER — PIOGLITAZONE HCL 30 MG PO TABS
30.0000 mg | ORAL_TABLET | Freq: Every day | ORAL | 4 refills | Status: DC
Start: 2022-11-14 — End: 2023-09-15

## 2022-11-14 MED ORDER — OMEPRAZOLE 40 MG PO CPDR
40.0000 mg | DELAYED_RELEASE_CAPSULE | Freq: Every day | ORAL | 4 refills | Status: DC | PRN
Start: 2022-11-14 — End: 2023-09-15

## 2022-11-14 NOTE — Assessment & Plan Note (Signed)
Chronic, stable period on PRN omeprazole

## 2022-11-14 NOTE — Patient Instructions (Addendum)
Labs today.  Consider miralax powder or senna for bowel regimen.  We will sign you up for colonoscopy after December.  I will ask lung cancer screening program to contact you about CT scan.  Schedule appointment with Dr Alvester Morin for diabetic eye exam . Good to see you today Return as needed or in 6 months for diabetes follow up visit

## 2022-11-14 NOTE — Assessment & Plan Note (Signed)
Continue aleve PRN.  Rx robaxin PRN, with sedation precautions.

## 2022-11-14 NOTE — Assessment & Plan Note (Signed)
Update PSA 

## 2022-11-14 NOTE — Assessment & Plan Note (Signed)
Chronic, stable on current regimen - update labs.  ?

## 2022-11-14 NOTE — Assessment & Plan Note (Signed)
Continue PRN meclizine with benefit.

## 2022-11-14 NOTE — Assessment & Plan Note (Signed)
Chronic, continue simvastatin, update FLP. The 10-year ASCVD risk score (Arnett DK, et al., 2019) is: 25.9%   Values used to calculate the score:     Age: 67 years     Sex: Male     Is Non-Hispanic African American: No     Diabetic: Yes     Tobacco smoker: Yes     Systolic Blood Pressure: 134 mmHg     Is BP treated: No     HDL Cholesterol: 55.9 mg/dL     Total Cholesterol: 160 mg/dL

## 2022-11-14 NOTE — Assessment & Plan Note (Signed)
Continue to encourage full cessation. Continues 1 ppd Will refer to lung cancer screening program.

## 2022-11-17 ENCOUNTER — Other Ambulatory Visit: Payer: Self-pay | Admitting: Family Medicine

## 2022-11-17 MED ORDER — VITAMIN B-12 1000 MCG PO TABS: 1000.0000 ug | ORAL_TABLET | ORAL | Status: AC

## 2022-12-25 ENCOUNTER — Other Ambulatory Visit: Payer: Self-pay | Admitting: Family Medicine

## 2023-01-15 ENCOUNTER — Telehealth: Payer: Self-pay

## 2023-01-15 NOTE — Telephone Encounter (Signed)
Received pt's Ozempic 0.25, 0.5 mg dose shipment (5 bxs total) today.   Spoke with pt notifying him of medication delivery. Verbalizes understanding.    [Placed Ozempic (5 bxs total) in 2nd refrigerator, top shelf.]

## 2023-01-20 NOTE — Telephone Encounter (Signed)
Patient picked up medication

## 2023-01-21 NOTE — Telephone Encounter (Signed)
Patient called in and stated that he picked up the medication yesterday. He stated that he left it in his car last night and wanted to know if it will be ok for him to take. Please advise. Thank you!

## 2023-01-21 NOTE — Telephone Encounter (Signed)
I think this should be ok. He should make sure solution is clear prior to injection; do not use if particles or color are seen in solution.

## 2023-01-21 NOTE — Telephone Encounter (Signed)
Called patient reviewed all information and repeated back to me. Will call if any questions.  ? ?

## 2023-02-12 ENCOUNTER — Encounter: Payer: Self-pay | Admitting: Internal Medicine

## 2023-02-16 ENCOUNTER — Other Ambulatory Visit: Payer: Self-pay | Admitting: Emergency Medicine

## 2023-02-16 ENCOUNTER — Telehealth: Payer: Self-pay | Admitting: Acute Care

## 2023-02-16 DIAGNOSIS — F17210 Nicotine dependence, cigarettes, uncomplicated: Secondary | ICD-10-CM

## 2023-02-16 DIAGNOSIS — Z122 Encounter for screening for malignant neoplasm of respiratory organs: Secondary | ICD-10-CM

## 2023-02-16 DIAGNOSIS — Z87891 Personal history of nicotine dependence: Secondary | ICD-10-CM

## 2023-02-16 NOTE — Telephone Encounter (Signed)
 Lung Cancer Screening Narrative/Criteria Questionnaire (Cigarette Smokers Only- No Cigars/Pipes/vapes)   Logan Tanner     SDMV:02/18/2023 with Rockie Myers, NP  Jun 17, 1955                         LDCT: 02/24/2023     68 y.o.   Phone: [phone]  Lung Screening Narrative   Before calling, confirm age (50-77 yrs Medicare / 50-80 yrs Private pay insurance)  Insurance information: Medicare    I am calling at the request of Dr. Rilla (referring provider) to schedule you for a lung screening.  Did your provider discuss this with you? Yes   This screening involves an initial meeting with our NP, who is the Nurse Navigator for the program.  It is called a shared decision making visit.  The initial meeting is required by insurance and Medicare to make sure you understand the program.  This appointment takes about 20 minutes to complete.  After you have spoken with the provider, we will schedule you for your screening scan.  This scan takes about 5-10 minutes to complete.  You can eat and drink normally before and after the scan.    I am going to ask you a few questions to make sure you meet the criteria to participate in the program.    Are you a current or former smoker? Current Age began smoking: 68 yo   If you are a former smoker, what year did you quit smoking? Patient quit for 10 years but began again (must be within 15 yrs)    To calculate your smoking history, I need an accurate estimate of how many packs of cigarettes you smoked per day and   for how many years.    Years smoking 40 x Packs per day 1 = Pack years 40   (at least 20 pack yrs)   (Make sure they understand that we need to know how much they have smoked in the past, not just the number of PPD they are smoking now)  Do you have a personal history of cancer?  No    Do you have a family history of cancer? No   Are you having any of the following symptoms?  Coughing up blood?  No  Weight loss of 15 lbs or more in  the last 6 months without trying / you cannot explain  No  It looks like you meet all criteria.  When would be a good time for us  to schedule you for this screening?   Additional information: N/A

## 2023-02-18 ENCOUNTER — Ambulatory Visit (INDEPENDENT_AMBULATORY_CARE_PROVIDER_SITE_OTHER): Payer: Self-pay | Admitting: Adult Health

## 2023-02-18 ENCOUNTER — Encounter: Payer: Self-pay | Admitting: Adult Health

## 2023-02-18 DIAGNOSIS — F17210 Nicotine dependence, cigarettes, uncomplicated: Secondary | ICD-10-CM | POA: Diagnosis not present

## 2023-02-18 NOTE — Progress Notes (Signed)
  Virtual Visit via Telephone Note  I connected with Logan Tanner , 02/18/23 10:20 AM by a telemedicine application and verified that I am speaking with the correct person using two identifiers.  Location: Patient: home Provider: home   I discussed the limitations of evaluation and management by telemedicine and the availability of in person appointments. The patient expressed understanding and agreed to proceed.   Shared Decision Making Visit Lung Cancer Screening Program [phone])   Eligibility: 68 y.o. Pack Years Smoking History Calculation =40 pack years (# packs/per year x # years smoked) Recent History of coughing up blood  no Unexplained weight loss? no ( >Than 15 pounds within the last 6 months ) Prior History Lung / other cancer no (Diagnosis within the last 5 years already requiring surveillance chest CT Scans). Smoking Status Current Smoker   Visit Components: Discussion included one or more decision making aids. YES Discussion included risk/benefits of screening. YES Discussion included potential follow up diagnostic testing for abnormal scans. YES Discussion included meaning and risk of over diagnosis. YES Discussion included meaning and risk of False Positives. YES Discussion included meaning of total radiation exposure. YES  Counseling Included: Importance of adherence to annual lung cancer LDCT screening. YES Impact of comorbidities on ability to participate in the program. YES Ability and willingness to under diagnostic treatment. YES  Smoking Cessation Counseling: Current Smokers:  Discussed importance of smoking cessation. yes Information about tobacco cessation classes and interventions provided to patient. yes Patient provided with ticket for LDCT Scan. yes Symptomatic Patient. NO Diagnosis Code: Tobacco Use Z72.0 Asymptomatic Patient yes  Counseling (Intermediate counseling: > three minutes counseling) H9563   Z12.2-Screening of respiratory  organs Z87.891-Personal history of nicotine dependence   [name] 02/18/23

## 2023-02-24 ENCOUNTER — Ambulatory Visit
Admission: RE | Admit: 2023-02-24 | Discharge: 2023-02-24 | Disposition: A | Discharge status: Home / Self Care | Payer: Medicare Other | Source: Ambulatory Visit | Attending: Acute Care | Admitting: Acute Care

## 2023-02-24 DIAGNOSIS — Z122 Encounter for screening for malignant neoplasm of respiratory organs: Secondary | ICD-10-CM | POA: Diagnosis not present

## 2023-02-24 DIAGNOSIS — F17210 Nicotine dependence, cigarettes, uncomplicated: Secondary | ICD-10-CM | POA: Insufficient documentation

## 2023-02-24 DIAGNOSIS — Z87891 Personal history of nicotine dependence: Secondary | ICD-10-CM | POA: Insufficient documentation

## 2023-03-05 ENCOUNTER — Other Ambulatory Visit: Payer: Self-pay

## 2023-03-05 DIAGNOSIS — Z122 Encounter for screening for malignant neoplasm of respiratory organs: Secondary | ICD-10-CM

## 2023-03-05 DIAGNOSIS — F17210 Nicotine dependence, cigarettes, uncomplicated: Secondary | ICD-10-CM

## 2023-03-05 DIAGNOSIS — Z87891 Personal history of nicotine dependence: Secondary | ICD-10-CM

## 2023-03-12 ENCOUNTER — Ambulatory Visit (AMBULATORY_SURGERY_CENTER): Payer: Medicare Other

## 2023-03-12 VITALS — Ht 68.0 in | Wt 184.0 lb

## 2023-03-12 DIAGNOSIS — Z1211 Encounter for screening for malignant neoplasm of colon: Secondary | ICD-10-CM

## 2023-03-12 NOTE — Progress Notes (Signed)
Pt's name and DOB verified at the beginning of the pre-visit wit 2 identifiers  Pt denies any difficulty with ambulating,sitting, laying down or rolling side to side  Pt has no issues with ambulation   Pt has no issues moving head neck or swallowing  No egg or soy allergy known to patient   No issues known to pt with past sedation with any surgeries or procedures  Patient denies ever being intubated  No FH of Malignant Hyperthermia  Pt is not on diet pills or shots  Pt is not on home 02   Pt is not on blood thinners   Pt denies issues with constipation   Pt is not on dialysis  Pt denise any abnormal heart rhythms   Pt denies any upcoming cardiac testing  Pt encouraged to use to use Singlecare or Goodrx to reduce cost   Patient's chart reviewed by Cathlyn Parsons CNRA prior to pre-visit and patient appropriate for the [facility].  Pre-visit completed and red dot placed by patient's name on their procedure day (on provider's schedule).  .  Visit by phone  Pt states weight is 184 lb  Instructed pt why it is important to and  to call if they have any changes in health or new medications. Directed them to the # given and on instructions.     Instructions reviewed. Pt given both [facility] main # and MD on call # prior to instructions.  Pt states understanding. Instructed to review again prior to procedure. Pt states they will.   Miralax split dose prep instructions sent to pt  via mail

## 2023-03-14 HISTORY — PX: COLONOSCOPY: SHX174

## 2023-03-19 ENCOUNTER — Telehealth: Payer: Self-pay | Admitting: Internal Medicine

## 2023-03-19 NOTE — Telephone Encounter (Signed)
 RTN call to patient and he states his prep instructions just came in the mail.  Denies further needs.

## 2023-03-19 NOTE — Telephone Encounter (Signed)
 Inbound call from patient stating he has not received any prep instructions. Please advise, thank you.

## 2023-03-31 ENCOUNTER — Encounter: Payer: Self-pay | Admitting: Internal Medicine

## 2023-04-02 ENCOUNTER — Ambulatory Visit (AMBULATORY_SURGERY_CENTER): Payer: Medicare Other | Admitting: Internal Medicine

## 2023-04-02 ENCOUNTER — Encounter: Payer: Self-pay | Admitting: Internal Medicine

## 2023-04-02 VITALS — BP 132/74 | HR 76 | Temp 97.2°F | Resp 14 | Ht 68.0 in | Wt 184.0 lb

## 2023-04-02 DIAGNOSIS — K648 Other hemorrhoids: Secondary | ICD-10-CM

## 2023-04-02 DIAGNOSIS — K5730 Diverticulosis of large intestine without perforation or abscess without bleeding: Secondary | ICD-10-CM | POA: Diagnosis not present

## 2023-04-02 DIAGNOSIS — Z1211 Encounter for screening for malignant neoplasm of colon: Secondary | ICD-10-CM

## 2023-04-02 DIAGNOSIS — D120 Benign neoplasm of cecum: Secondary | ICD-10-CM | POA: Diagnosis not present

## 2023-04-02 HISTORY — DX: Benign neoplasm of cecum: D12.0

## 2023-04-02 MED ORDER — SODIUM CHLORIDE 0.9 % IV SOLN: 500.0000 mL | INTRAVENOUS | Status: DC

## 2023-04-02 NOTE — Progress Notes (Signed)
 Pt's states no medical or surgical changes since previsit or office visit.

## 2023-04-02 NOTE — Progress Notes (Signed)
 Report to PACU, RN, vss, BBS= Clear.
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
## 2023-04-02 NOTE — Patient Instructions (Addendum)
 I found and removed one polyp - it was large but looks benign.  You also have a condition called diverticulosis - common and not usually a problem. Please read the handout provided.  Internal hemorrhoids were swollen and seen also - common after a colonoscopy prep.  I will let you know pathology results and when to have another routine colonoscopy by mail and/or My Chart.  I appreciate the opportunity to care for you. Iva Boop, MD, FACG   YOU HAD AN ENDOSCOPIC PROCEDURE TODAY AT THE [facility]:   Refer to the procedure report that was given to you for any specific questions about what was found during the examination.  If the procedure report does not answer your questions, please call your gastroenterologist to clarify.  If you requested that your care partner not be given the details of your procedure findings, then the procedure report has been included in a sealed envelope for you to review at your convenience later.  YOU SHOULD EXPECT: Some feelings of bloating in the abdomen. Passage of more gas than usual.  Walking can help get rid of the air that was put into your GI tract during the procedure and reduce the bloating. If you had a lower endoscopy (such as a colonoscopy or flexible sigmoidoscopy) you may notice spotting of blood in your stool or on the toilet paper. If you underwent a bowel prep for your procedure, you may not have a normal bowel movement for a few days.  Please Note:  You might notice some irritation and congestion in your nose or some drainage.  This is from the oxygen used during your procedure.  There is no need for concern and it should clear up in a day or so.  SYMPTOMS TO REPORT IMMEDIATELY:  Following lower endoscopy (colonoscopy or flexible sigmoidoscopy):  Excessive amounts of blood in the stool  Significant tenderness or worsening of abdominal pains  Swelling of the abdomen that is new, acute  Fever of 100F or higher   For urgent or  emergent issues, a gastroenterologist can be reached at any hour by calling [phone]. Do not use MyChart messaging for urgent concerns.    DIET:  We do recommend a small meal at first, but then you may proceed to your regular diet.  Drink plenty of fluids but you should avoid alcoholic beverages for 24 hours.  ACTIVITY:  You should plan to take it easy for the rest of today and you should NOT DRIVE or use heavy machinery until tomorrow (because of the sedation medicines used during the test).    FOLLOW UP: Our staff will call the number listed on your records the next business day following your procedure.  We will call around 7:15- 8:00 am to check on you and address any questions or concerns that you may have regarding the information given to you following your procedure. If we do not reach you, we will leave a message.     If any biopsies were taken you will be contacted by phone or by letter within the next 1-3 weeks.  Please call us at [phone] if you have not heard about the biopsies in 3 weeks.    SIGNATURES/CONFIDENTIALITY: You and/or your care partner have signed paperwork which will be entered into your electronic medical record.  These signatures attest to the fact that that the information above on your After Visit Summary has been reviewed and is understood.  Full responsibility of the confidentiality of  this discharge information lies with you and/or your care-partner.

## 2023-04-02 NOTE — Progress Notes (Signed)
 Called to room to assist during endoscopic procedure.  Patient ID and intended procedure confirmed with present staff. Received instructions for my participation in the procedure from the performing physician.

## 2023-04-02 NOTE — Op Note (Signed)
 [facility] Patient Name: Logan Tanner Procedure Date: 04/02/2023 11:20 AM MRN: [id number] Endoscopist: Iva Boop , MD, [id number] Age: 68 Referring MD:  Date of Birth: July 27, 1955 Gender: Male Account #: [id number] Procedure:                Colonoscopy Indications:              Screening for colorectal malignant neoplasm, Last                            colonoscopy: 2013 Medicines:                Monitored Anesthesia Care Procedure:                Pre-Anesthesia Assessment:                           - Prior to the procedure, a History and Physical                            was performed, and patient medications and                            allergies were reviewed. The patient's tolerance of                            previous anesthesia was also reviewed. The risks                            and benefits of the procedure and the sedation                            options and risks were discussed with the patient.                            All questions were answered, and informed consent                            was obtained. Prior Anticoagulants: The patient has                            taken no anticoagulant or antiplatelet agents. ASA                            Grade Assessment: II - A patient with mild systemic                            disease. After reviewing the risks and benefits,                            the patient was deemed in satisfactory condition to                            undergo the procedure.  After obtaining informed consent, the colonoscope                            was passed under direct vision. Throughout the                            procedure, the patient's blood pressure, pulse, and                            oxygen saturations were monitored continuously. The                            Olympus Scope SN [id number] was introduced through the                            anus and advanced to the the cecum,  identified by                            appendiceal orifice and ileocecal valve. The                            colonoscopy was performed without difficulty. The                            patient tolerated the procedure well. The bowel                            preparation used was Miralax via split dose                            instruction. Scope In: 11:35:18 AM Scope Out: 11:57:38 AM Scope Withdrawal Time: 0 hours 18 minutes 30 seconds  Total Procedure Duration: 0 hours 22 minutes 20 seconds  Findings:                 The digital rectal exam findings include enlarged                            prostate. Pertinent negatives include no palpable                            rectal lesions.                           A 20 to 25 mm polyp was found in the cecum. The                            polyp was carpet-like. The polyp was removed with a                            cold snare. The polyp was removed with a saline                            injection-lift technique using a cold snare. The  polyp was removed with a piecemeal technique using                            a cold snare. Resection and retrieval were                            complete. Verification of patient identification                            for the specimen was done. Estimated blood loss was                            minimal.                           Multiple diverticula were found in the entire colon.                           Internal hemorrhoids were found.                           The exam was otherwise without abnormality on                            direct and retroflexion views. Complications:            No immediate complications. Estimated Blood Loss:     Estimated blood loss was minimal. Impression:               - Enlarged prostate found on digital rectal exam.                           - One 20 to 25 mm polyp in the cecum, removed with                            a cold snare,  removed using injection-lift and a                            cold snare and removed piecemeal using a cold                            snare. Resected and retrieved.                           - Diverticulosis in the entire examined colon.                           - Internal hemorrhoids.                           - The examination was otherwise normal on direct                            and retroflexion views. Recommendation:           - Patient has a contact number available  for                            emergencies. The signs and symptoms of potential                            delayed complications were discussed with the                            patient. Return to normal activities tomorrow.                            Written discharge instructions were provided to the                            patient.                           - Resume previous diet.                           - Continue present medications.                           - Await pathology results.                           - Repeat colonoscopy is recommended. The                            colonoscopy date will be determined after pathology                            results from today's exam become available for                            review. Iva Boop, MD 04/02/2023 12:05:16 PM This report has been signed electronically.

## 2023-04-03 ENCOUNTER — Telehealth: Payer: Self-pay | Admitting: *Deleted

## 2023-04-03 NOTE — Telephone Encounter (Signed)
  Follow up Call-     04/02/2023   10:34 AM  Call back number  Post procedure Call Back phone  # [phone]  Permission to leave phone message Yes     Patient questions:  Do you have a fever, pain , or abdominal swelling? No. Pain Score  0 *  Have you tolerated food without any problems? Yes.    Have you been able to return to your normal activities? Yes.    Do you have any questions about your discharge instructions: Diet   No. Medications  No. Follow up visit  No.  Do you have questions or concerns about your Care? No.  Actions: * If pain score is 4 or above: No action needed, pain <4.

## 2023-04-06 LAB — SURGICAL PATHOLOGY

## 2023-04-08 ENCOUNTER — Telehealth: Payer: Self-pay

## 2023-04-08 NOTE — Telephone Encounter (Signed)
 HAVE RECEIVED NOTIFICATION FROM [facility] REGARDING PT HAVE AUTO REFILLS BUT PT IS OUT OF REFILLS.  PT NEEDS REFILL  SENT TO [facility] FOR OZEMPIC 0.25/.5MG   PLEASE BE ADVISED I HAVE FAXED THE PROVIDER PAGES TO OFFICE OF JAVIER GUTIERREZ MD. FOR THEM TO SEND TO COMPANY.

## 2023-04-09 MED ORDER — OZEMPIC (0.25 OR 0.5 MG/DOSE) 2 MG/3ML ~~LOC~~ SOPN: 0.5000 mg | PEN_INJECTOR | SUBCUTANEOUS | 3 refills | Status: AC

## 2023-04-09 NOTE — Addendum Note (Signed)
 Addended by: Eustaquio Boyden on: 04/09/2023 08:14 AM   Modules accepted: Orders

## 2023-04-09 NOTE — Telephone Encounter (Addendum)
 Signed and in Lisa's box.  New Rx printed and in attached to request.

## 2023-04-09 NOTE — Telephone Encounter (Signed)
 Faxed REFILLED  REQUEST TO to [employer] at [phone] FOR REFILL PROCESSING    PLEASE BE ADVISED

## 2023-04-09 NOTE — Telephone Encounter (Addendum)
 Emailed form and rx to [employer] at [email or web address], per her request (see other messages by clicking blue link '[address]').  Placed form back in Lindsay's folder at [facility] front office.

## 2023-04-09 NOTE — Telephone Encounter (Addendum)
 Faxed form and rx to [facility] Patient Advocate Team at [phone].   Placed form, rx and fax confirmation in Lindsay's folder.

## 2023-04-09 NOTE — Telephone Encounter (Signed)
 Noted.

## 2023-04-09 NOTE — Telephone Encounter (Signed)
 Printed form and placed in Dr Timoteo Expose box to sign.

## 2023-04-15 ENCOUNTER — Encounter: Payer: Self-pay | Admitting: Internal Medicine

## 2023-04-15 DIAGNOSIS — D120 Benign neoplasm of cecum: Secondary | ICD-10-CM

## 2023-04-17 ENCOUNTER — Telehealth: Payer: Self-pay

## 2023-04-17 NOTE — Telephone Encounter (Signed)
 Received pt's Ozempic 0.25, 0.5 mg dose shipment (5 bxs total) today.   Spoke with pt notifying him of medication delivery. Verbalizes understanding.    [Placed Ozempic (5 bxs total) in 2nd refrigerator, top shelf.]

## 2023-05-15 ENCOUNTER — Encounter: Payer: Self-pay | Admitting: Family Medicine

## 2023-05-15 ENCOUNTER — Ambulatory Visit (INDEPENDENT_AMBULATORY_CARE_PROVIDER_SITE_OTHER): Payer: Self-pay | Admitting: Family Medicine

## 2023-05-15 VITALS — BP 128/60 | HR 83 | Temp 98.1°F | Ht 68.0 in | Wt 189.1 lb

## 2023-05-15 DIAGNOSIS — Z7984 Long term (current) use of oral hypoglycemic drugs: Secondary | ICD-10-CM | POA: Diagnosis not present

## 2023-05-15 DIAGNOSIS — Z7985 Long-term (current) use of injectable non-insulin antidiabetic drugs: Secondary | ICD-10-CM

## 2023-05-15 DIAGNOSIS — E1169 Type 2 diabetes mellitus with other specified complication: Secondary | ICD-10-CM

## 2023-05-15 LAB — POCT GLYCOSYLATED HEMOGLOBIN (HGB A1C): Hemoglobin A1C: 6.8 % — AB (ref 4.0–5.6)

## 2023-05-15 NOTE — Patient Instructions (Addendum)
 Schedule eye exam with Dr Alvester Morin.  You are doing well today -continue current medicines.  Good to see you today  Return as needed or in 3-4 months for welcome to medicare visit.

## 2023-05-15 NOTE — Progress Notes (Signed)
 Ph: [phone] Fax: [phone]   Patient ID: Logan Tanner, male    DOB: [birth year], 68 y.o.   MRN: [id number]  This visit was conducted in person.  BP 128/60 (BP Location: Right Arm, Cuff Size: Large)   Pulse 83   Temp 98.1 F (36.7 C) (Oral)   Ht 5\' 8"  (1.727 m)   Wt 189 lb 2 oz (85.8 kg)   SpO2 95%   BMI 28.76 kg/m   BP Readings from Last 3 Encounters:  05/15/23 128/60  04/02/23 132/74  11/14/22 134/76   CC: 6 mo DM f/u visit  Subjective:   HPI: Logan Tanner is a 68 y.o. male presenting on 05/15/2023 for Medical Management of Chronic Issues (Here for 6 mo DM f/u.)   New BC[employer] medicare 12/2022.   Worse diet recently.  Mild leg swelling, no orthopnea  DM - does not regularly check sugars. Compliant with antihyperglycemic regimen which includes: metformin 1000mg  bid, actos 30mg  daily, ozempic 0.5mg  weekly via [employer] PAP. Notes increased constipation. Denies low sugars or hypoglycemic symptoms. Denies blurry vision. Chronic L anterior thigh numbness and right foot numbness thought from lumbar radiculopathy, worse with prolonged walking. Last diabetic eye exam DUE. Glucometer brand: [employer]. Last foot exam: DUE. DSME: previously declined. Lab Results  Component Value Date   HGBA1C 6.8 (A) 05/15/2023   Diabetic Foot Exam - Simple   Simple Foot Form Diabetic Foot exam was performed with the following findings: Yes 05/15/2023  7:56 AM  Visual Inspection See comments: Yes Sensation Testing Intact to touch and monofilament testing bilaterally: Yes Pulse Check Posterior Tibialis and Dorsalis pulse intact bilaterally: Yes Comments No claudication Some maceration between L 4th5th toes    Lab Results  Component Value Date   MICROALBUR 1.5 11/14/2022     COLONOSCOPY 03/2023 - 2+cm TA, diverticulosis, int hem, rpt 6 months given size [facility])   Lung cancer screen 02/2023 - CA calcification present, mild ATH lf thoracic aorta, mild emphysema changes, 3mm L  upper pole kidney stone.      Relevant past medical, surgical, family and social history reviewed and updated as indicated. Interim medical history since our last visit reviewed. Allergies and medications reviewed and updated. Outpatient Medications Prior to Visit  Medication Sig Dispense Refill   Coenzyme Q10 (CO Q 10) 100 MG CAPS Take 1 capsule by mouth daily. 30 capsule    cyanocobalamin (VITAMIN B12) 1000 MCG tablet Take 1 tablet (1,000 mcg total) by mouth every Monday, Wednesday, and Friday.     meclizine (ANTIVERT) 25 MG tablet Take 1 tablet (25 mg total) by mouth 2 (two) times daily as needed for dizziness (sedating). 30 tablet 3   metFORMIN (GLUCOPHAGE) 1000 MG tablet Take 1 tablet (1,000 mg total) by mouth 2 (two) times daily with a meal. 180 tablet 4   methocarbamol (ROBAXIN) 500 MG tablet TAKE 1 TABLET BY MOUTH EVERY 8 HOURS AS NEEDED FOR MUSCLE SPASMS 40 tablet 1   naproxen sodium (ANAPROX) 220 MG tablet Take 220 mg by mouth as needed.     omeprazole (PRILOSEC) 40 MG capsule Take 1 capsule (40 mg total) by mouth daily as needed. 90 capsule 4   pioglitazone (ACTOS) 30 MG tablet Take 1 tablet (30 mg total) by mouth daily. 90 tablet 4   Semaglutide,0.25 or 0.5MG /DOS, (OZEMPIC, 0.25 OR 0.5 MG/DOSE,) 2 MG/3ML SOPN Inject 0.5 mg as directed once a week. Via [employer] PAP. 9 mL 3   simvastatin (ZOCOR) 20 MG tablet  TAKE 1 TABLET BY MOUTH EVERY OTHER DAY 45 tablet 5   No facility-administered medications prior to visit.     Per HPI unless specifically indicated in ROS section below Review of Systems  Objective:  BP 128/60 (BP Location: Right Arm, Cuff Size: Large)   Pulse 83   Temp 98.1 F (36.7 C) (Oral)   Ht 5\' 8"  (1.727 m)   Wt 189 lb 2 oz (85.8 kg)   SpO2 95%   BMI 28.76 kg/m   Wt Readings from Last 3 Encounters:  05/15/23 189 lb 2 oz (85.8 kg)  04/02/23 184 lb (83.5 kg)  03/12/23 184 lb (83.5 kg)      Physical Exam Vitals and nursing note reviewed.  Constitutional:       Appearance: Normal appearance. He is not ill-appearing.  Eyes:     Extraocular Movements: Extraocular movements intact.     Conjunctiva/sclera: Conjunctivae normal.     Pupils: Pupils are equal, round, and reactive to light.  Neck:     Thyroid: No thyroid mass or thyromegaly.  Cardiovascular:     Rate and Rhythm: Normal rate and regular rhythm.     Pulses: Normal pulses.     Heart sounds: Normal heart sounds. No murmur heard. Pulmonary:     Effort: Pulmonary effort is normal. No respiratory distress.     Breath sounds: Normal breath sounds. No wheezing, rhonchi or rales.  Musculoskeletal:     Right lower leg: No edema.     Left lower leg: No edema.     Comments: See HPI for foot exam if done  Skin:    General: Skin is warm and dry.     Findings: No rash.  Neurological:     Mental Status: He is alert.  Psychiatric:        Mood and Affect: Mood normal.        Behavior: Behavior normal.        Lab Results  Component Value Date   VITAMINB12 1,116 (H) 11/14/2022    Assessment & Plan:   Problem List Items Addressed This Visit     Type 2 diabetes mellitus with other specified complication (HCC) - Primary   Chronic, stable on current regimen - continue.  Foot exam today Recommend he schedule diabetic eye exam.       Relevant Orders   POCT glycosylated hemoglobin (Hb A1C) (Completed)     No orders of the defined types were placed in this encounter.   Orders Placed This Encounter  Procedures   POCT glycosylated hemoglobin (Hb A1C)    Patient Instructions  Schedule eye exam with Dr Alvester Morin.  You are doing well today -continue current medicines.  Good to see you today  Return as needed or in 3-4 months for welcome to medicare visit.   Follow up plan: Return in about 3 months (around 08/14/2023) for welcome to medicare visit .  Logan Boyden, MD

## 2023-05-15 NOTE — Assessment & Plan Note (Signed)
 Chronic, stable on current regimen - continue.  Foot exam today Recommend he schedule diabetic eye exam.

## 2023-08-03 DIAGNOSIS — K080 Exfoliation of teeth due to systemic causes: Secondary | ICD-10-CM | POA: Diagnosis not present

## 2023-08-31 DIAGNOSIS — K080 Exfoliation of teeth due to systemic causes: Secondary | ICD-10-CM | POA: Diagnosis not present

## 2023-09-09 ENCOUNTER — Telehealth: Payer: Self-pay

## 2023-09-09 NOTE — Telephone Encounter (Signed)
 Gave pt a call pt is coming up due for re-enrollment on 10/04/23,spoke with pt gave consent to fill his pap online and has fax provider potion today.

## 2023-09-15 ENCOUNTER — Encounter: Payer: Self-pay | Admitting: Family Medicine

## 2023-09-15 ENCOUNTER — Ambulatory Visit (INDEPENDENT_AMBULATORY_CARE_PROVIDER_SITE_OTHER): Admitting: Family Medicine

## 2023-09-15 VITALS — BP 124/76 | HR 79 | Temp 97.7°F | Ht 65.5 in | Wt 179.1 lb

## 2023-09-15 DIAGNOSIS — Z125 Encounter for screening for malignant neoplasm of prostate: Secondary | ICD-10-CM | POA: Diagnosis not present

## 2023-09-15 DIAGNOSIS — D120 Benign neoplasm of cecum: Secondary | ICD-10-CM

## 2023-09-15 DIAGNOSIS — R42 Dizziness and giddiness: Secondary | ICD-10-CM

## 2023-09-15 DIAGNOSIS — R351 Nocturia: Secondary | ICD-10-CM

## 2023-09-15 DIAGNOSIS — E785 Hyperlipidemia, unspecified: Secondary | ICD-10-CM

## 2023-09-15 DIAGNOSIS — K219 Gastro-esophageal reflux disease without esophagitis: Secondary | ICD-10-CM

## 2023-09-15 DIAGNOSIS — Z23 Encounter for immunization: Secondary | ICD-10-CM

## 2023-09-15 DIAGNOSIS — Z7189 Other specified counseling: Secondary | ICD-10-CM | POA: Insufficient documentation

## 2023-09-15 DIAGNOSIS — Z0000 Encounter for general adult medical examination without abnormal findings: Secondary | ICD-10-CM | POA: Diagnosis not present

## 2023-09-15 DIAGNOSIS — R400 Somnolence: Secondary | ICD-10-CM

## 2023-09-15 DIAGNOSIS — R5382 Chronic fatigue, unspecified: Secondary | ICD-10-CM | POA: Diagnosis not present

## 2023-09-15 DIAGNOSIS — E663 Overweight: Secondary | ICD-10-CM

## 2023-09-15 DIAGNOSIS — Z7984 Long term (current) use of oral hypoglycemic drugs: Secondary | ICD-10-CM

## 2023-09-15 DIAGNOSIS — N401 Enlarged prostate with lower urinary tract symptoms: Secondary | ICD-10-CM

## 2023-09-15 DIAGNOSIS — F17200 Nicotine dependence, unspecified, uncomplicated: Secondary | ICD-10-CM

## 2023-09-15 DIAGNOSIS — E1169 Type 2 diabetes mellitus with other specified complication: Secondary | ICD-10-CM

## 2023-09-15 LAB — COMPREHENSIVE METABOLIC PANEL WITH GFR
ALT: 21 U/L (ref 0–53)
AST: 14 U/L (ref 0–37)
Albumin: 4.3 g/dL (ref 3.5–5.2)
Alkaline Phosphatase: 60 U/L (ref 39–117)
BUN: 10 mg/dL (ref 6–23)
CO2: 30 meq/L (ref 19–32)
Calcium: 9.6 mg/dL (ref 8.4–10.5)
Chloride: 106 meq/L (ref 96–112)
Creatinine, Ser: 0.71 mg/dL (ref 0.40–1.50)
GFR: 96.13 mL/min (ref >60.00)
Glucose, Bld: 124 mg/dL — ABNORMAL HIGH (ref 70–99)
Potassium: 5.2 meq/L — ABNORMAL HIGH (ref 3.5–5.1)
Sodium: 140 meq/L (ref 135–145)
Total Bilirubin: 0.4 mg/dL (ref 0.2–1.2)
Total Protein: 6.8 g/dL (ref 6.0–8.3)

## 2023-09-15 LAB — POC URINALSYSI DIPSTICK (AUTOMATED)
Bilirubin, UA: NEGATIVE
Blood, UA: NEGATIVE
Glucose, UA: NEGATIVE
Ketones, UA: NEGATIVE
Leukocytes, UA: NEGATIVE
Nitrite, UA: NEGATIVE
Protein, UA: NEGATIVE
Spec Grav, UA: 1.01 (ref 1.010–1.025)
Urobilinogen, UA: 0.2 U/dL
pH, UA: 6 (ref 5.0–8.0)

## 2023-09-15 LAB — MICROALBUMIN / CREATININE URINE RATIO
Creatinine,U: 36.7 mg/dL
Microalb Creat Ratio: 26 mg/g (ref 0.0–30.0)
Microalb, Ur: 1 mg/dL (ref 0.0–1.9)

## 2023-09-15 LAB — TSH: TSH: 1.64 u[IU]/mL (ref 0.35–5.50)

## 2023-09-15 LAB — LIPID PANEL
Cholesterol: 152 mg/dL (ref 0–200)
HDL: 56.8 mg/dL (ref >39.00)
LDL Cholesterol: 78 mg/dL (ref 0–99)
NonHDL: 94.98
Total CHOL/HDL Ratio: 3
Triglycerides: 87 mg/dL (ref 0.0–149.0)
VLDL: 17.4 mg/dL (ref 0.0–40.0)

## 2023-09-15 LAB — CBC WITH DIFFERENTIAL/PLATELET
Basophils Absolute: 0.1 K/uL (ref 0.0–0.1)
Basophils Relative: 1 % (ref 0.0–3.0)
Eosinophils Absolute: 0.4 K/uL (ref 0.0–0.7)
Eosinophils Relative: 6.8 % — ABNORMAL HIGH (ref 0.0–5.0)
HCT: 42.8 % (ref 39.0–52.0)
Hemoglobin: 14.3 g/dL (ref 13.0–17.0)
Lymphocytes Relative: 29.9 % (ref 12.0–46.0)
Lymphs Abs: 1.9 K/uL (ref 0.7–4.0)
MCHC: 33.5 g/dL (ref 30.0–36.0)
MCV: 92.6 fl (ref 78.0–100.0)
Monocytes Absolute: 0.4 K/uL (ref 0.1–1.0)
Monocytes Relative: 6.3 % (ref 3.0–12.0)
Neutro Abs: 3.6 K/uL (ref 1.4–7.7)
Neutrophils Relative %: 56 % (ref 43.0–77.0)
Platelets: 362 K/uL (ref 150.0–400.0)
RBC: 4.62 Mil/uL (ref 4.22–5.81)
RDW: 13.5 % (ref 11.5–15.5)
WBC: 6.5 K/uL (ref 4.0–10.5)

## 2023-09-15 LAB — HEMOGLOBIN A1C: Hgb A1c MFr Bld: 7.1 % — ABNORMAL HIGH (ref 4.6–6.5)

## 2023-09-15 LAB — PSA: PSA: 1.53 ng/mL (ref 0.10–4.00)

## 2023-09-15 LAB — VITAMIN B12: Vitamin B-12: 717 pg/mL (ref 211–911)

## 2023-09-15 MED ORDER — OMEPRAZOLE 40 MG PO CPDR
40.0000 mg | DELAYED_RELEASE_CAPSULE | Freq: Every day | ORAL | 3 refills | Status: AC | PRN
Start: 2023-09-15 — End: ?

## 2023-09-15 MED ORDER — METHOCARBAMOL 500 MG PO TABS: 500.0000 mg | ORAL_TABLET | Freq: Three times a day (TID) | ORAL | 1 refills | Status: DC | PRN

## 2023-09-15 MED ORDER — MECLIZINE HCL 25 MG PO TABS
25.0000 mg | ORAL_TABLET | Freq: Two times a day (BID) | ORAL | 1 refills | Status: AC | PRN
Start: 2023-09-15 — End: ?

## 2023-09-15 MED ORDER — SIMVASTATIN 20 MG PO TABS: 20.0000 mg | ORAL_TABLET | ORAL | 3 refills | Status: AC

## 2023-09-15 MED ORDER — METFORMIN HCL 1000 MG PO TABS: 1000.0000 mg | ORAL_TABLET | Freq: Two times a day (BID) | ORAL | 3 refills | Status: AC

## 2023-09-15 MED ORDER — PIOGLITAZONE HCL 30 MG PO TABS: 30.0000 mg | ORAL_TABLET | Freq: Every day | ORAL | 3 refills | Status: AC
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
## 2023-09-15 NOTE — Patient Instructions (Addendum)
 Labs today  Urinalysis today  Prevnar-20 today (pnemonia shot). If interested, check with pharmacy about new 2 shot shingles series (shingrix).  Ok to stop actos , but monitor sugar control coming off of it. If sugars trending up, let us  know to increase ozempic  dose.  Schedule diabetic eye exam (Dr Carolee) Advanced directive packet provided today.   Logan Tanner , Thank you for taking time to come for your Medicare Wellness Visit. I appreciate your ongoing commitment to your health goals. Please review the following plan we discussed and let me know if I can assist you in the future.   These are the goals we discussed:  Goals      Increase physical activity        This is a list of the screening recommended for you and due dates:  Health Maintenance  Topic Date Due   HIV Screening  Never done   Zoster (Shingles) Vaccine (1 of 2) Never done   Yearly kidney health urinalysis for diabetes  07/28/2010   Pneumococcal Vaccine for age over 33 (2 of 2 - PCV) 03/09/2012   Eye exam for diabetics  01/20/2019   DTaP/Tdap/Td vaccine (3 - Tdap) 03/07/2020   COVID-19 Vaccine (4 - 2024-25 season) 10/12/2022   Flu Shot  09/11/2023   Yearly kidney function blood test for diabetes  11/14/2023   Hemoglobin A1C  11/14/2023   Screening for Lung Cancer  02/24/2024   Colon Cancer Screening  04/01/2024   Complete foot exam   05/14/2024   Medicare Annual Wellness Visit  09/14/2024   Hepatitis C Screening  Completed   Hepatitis B Vaccine  Aged Out   HPV Vaccine  Aged Out   Meningitis B Vaccine  Aged Out

## 2023-09-15 NOTE — Assessment & Plan Note (Signed)
 Recent colonoscopy 03/2023 - with 25mm TA - planned rpt colonoscopy this fall.

## 2023-09-15 NOTE — Assessment & Plan Note (Signed)

## 2023-09-15 NOTE — Assessment & Plan Note (Signed)
 Continue to encourage cessation. Reviewed risks of heart disease in ongoing smoking.  Continue lung cancer screening CT

## 2023-09-15 NOTE — Assessment & Plan Note (Signed)
 Advanced directive: discussed. Wife would be HCPOA. Full code, but wouldn't want prolonged life support if terminal condition. Packet provided today.

## 2023-09-16 ENCOUNTER — Encounter: Payer: Self-pay | Admitting: Family Medicine

## 2023-09-16 DIAGNOSIS — R400 Somnolence: Secondary | ICD-10-CM | POA: Insufficient documentation

## 2023-09-16 NOTE — Assessment & Plan Note (Addendum)
 Notes ongoing daytime somnolence. ESS = 10 Will order home sleep test to further evaluate for sleep apnea. Reviewed consequences of untreated sleep apnea.

## 2023-09-16 NOTE — Assessment & Plan Note (Addendum)
 Chronic, continue PRN omeprazole 

## 2023-09-16 NOTE — Assessment & Plan Note (Signed)
Continue yearly PSA.  

## 2023-09-16 NOTE — Assessment & Plan Note (Signed)
 Chronic, on simvastatin  20mg . Update FLP. The 10-year ASCVD risk score (Arnett DK, et al., 2019) is: 23.3%   Values used to calculate the score:     Age: 68 years     Clincally relevant sex: Male     Is Non-Hispanic African American: No     Diabetic: Yes     Tobacco smoker: Yes     Systolic Blood Pressure: 124 mmHg     Is BP treated: No     HDL Cholesterol: 56.8 mg/dL     Total Cholesterol: 152 mg/dL

## 2023-09-16 NOTE — Assessment & Plan Note (Signed)
 Update labs for reversible causes of fatigue (check TSH, B12 CBC)

## 2023-09-16 NOTE — Assessment & Plan Note (Signed)
 Chronic, stable period on current regimen of actos , metformin , ozempic .  Update A1c.

## 2023-09-16 NOTE — Assessment & Plan Note (Signed)
 H/o episodes of vertigo/dizziness managed with meclizine .  Head CT 07/2021 reassuring. Saw neurology at that time as well.  Pending brain MRI through [facility].

## 2023-09-16 NOTE — Assessment & Plan Note (Signed)
 Continue to encourage healthy diet and lifestyle choices to maintain weight loss.

## 2023-09-20 ENCOUNTER — Ambulatory Visit: Payer: Self-pay | Admitting: Family Medicine

## 2023-09-21 ENCOUNTER — Telehealth: Payer: Self-pay | Admitting: Family Medicine

## 2023-09-21 ENCOUNTER — Other Ambulatory Visit: Payer: Self-pay | Admitting: Pharmacist

## 2023-09-21 DIAGNOSIS — E1169 Type 2 diabetes mellitus with other specified complication: Secondary | ICD-10-CM

## 2023-09-21 NOTE — Progress Notes (Signed)
 Brief Telephone Documentation Reason for Call: Patient left message regarding medication-related question  Summary of Call: Called patient 09/21/23.  Patient reports he received a letter in the mail form [employer] stating they are missing information for his renewal application.   Appears the Med Access team has been working with patient in re-enrollment and has submitted patient portion online. Likely awaiting provider pages.  Advised patient he should receive another letter in the mail informing him of approval once all portions are processed by [employer].   Follow Up: Note forwarded to PCP POOL / Med Access team   Manuelita FABIENE Kobs, PharmD Clinical Pharmacist [facility] [phone]

## 2023-09-21 NOTE — Telephone Encounter (Signed)
 Pt request call from [place]. Has questions about DM meds.

## 2023-09-22 NOTE — Telephone Encounter (Signed)
 Faxing provider portion, have not received provider portion.

## 2023-09-23 NOTE — Telephone Encounter (Signed)
 Received provider portion of pap [employer] faxed it to [employer] today will follow up in a few days.

## 2023-09-25 NOTE — Telephone Encounter (Signed)
 Gave [employer] a call to follow up on pap Ozempic , per [employer] pt has been approved thru 02/10/2024 and has been mail out should received with in 10-14 days.left a HIPAA VM at pt number.

## 2023-10-02 ENCOUNTER — Telehealth: Payer: Self-pay | Admitting: Internal Medicine

## 2023-10-02 NOTE — Telephone Encounter (Signed)
 Patient needs to be set up for a repeat colonoscopy to follow-up large cecal polyp removed earlier this year.  Please make pre-visit and colonoscopy [facility] appointment  thanks

## 2023-10-05 NOTE — Telephone Encounter (Signed)
 Spoke with patient and he stated that he is having to miss a lot of time away from work having to take care of his wife due to her breaking her heal and having to take her to doctor appointment. Patient asked that I reach back out to schedule him for January. I have added him to my list to call when the schedule is open.

## 2023-10-05 NOTE — Telephone Encounter (Signed)
Dr. Gessner FYI 

## 2023-10-05 NOTE — Telephone Encounter (Signed)
 Please contact the patient for scheduling [facility] and with pre-visit for colonoscopy. Thanks

## 2023-10-05 NOTE — Telephone Encounter (Signed)
 OK  - please place a December recall as a backup also

## 2023-10-09 ENCOUNTER — Other Ambulatory Visit: Payer: Self-pay | Admitting: Pharmacist

## 2023-10-09 ENCOUNTER — Telehealth: Payer: Self-pay

## 2023-10-09 DIAGNOSIS — E1169 Type 2 diabetes mellitus with other specified complication: Secondary | ICD-10-CM

## 2023-10-09 NOTE — Progress Notes (Signed)
 Brief Telephone Documentation Reason for Call: Patient left message regarding question for pharmacist   Summary of Call: Patient received letter of approval from [employer] through 02/10/24 and was confused of the enrollment expiration date.   Reviewed that enrollment is through a calendar year, though re-enrollment each year begins ~October-November. He should receive re-enrollment letter in the mail around this time. Our PAP program may also reach out to him to facilitate re--enrollment for 2026.   Patient verbalizes understanding. No further questions.

## 2023-10-09 NOTE — Telephone Encounter (Signed)
 Received patient assistance medications for patient.  Ozempic  4boxes  Medications have been placed in the refrigerator and labeled with patient information  and Patient has been informed can pick up medications in our office during normal business hours.    Patient wanted to let Manuelita know that his approval letter only had approved to December. He wanted to know if he has to renew it at that time? Advised patient we will send her message and she will reach out.

## 2023-11-04 NOTE — Telephone Encounter (Signed)
 Patient picked up medication in office today

## 2023-11-11 ENCOUNTER — Telehealth: Payer: Self-pay

## 2023-11-11 NOTE — Addendum Note (Signed)
 Addended by: RILLA BALLER on: 11/11/2023 04:57 PM   Modules accepted: Orders
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
## 2023-11-11 NOTE — Telephone Encounter (Signed)
 Copied from CRM #[id number]. Topic: Clinical - Medication Question >> Nov 11, 2023 11:42 AM Dedra NOVAK wrote: Reason for CRM: Pt is requesting a prescription for tetanus vaccine be sent to [facility]. Pls notify pt when it has been sent.

## 2023-11-11 NOTE — Telephone Encounter (Signed)
 Spoke with Elspeth, of [facility], to confirm rx is needed for pt to get Tdap vaccine. Says, yes, rx is needed to be able to bill insurance.

## 2023-11-12 MED ORDER — TETANUS-DIPHTH-ACELL PERTUSSIS 5-2-15.5 LF-MCG/0.5 IM SUSP: 0.5000 mL | Freq: Once | INTRAMUSCULAR | 0 refills | Status: AC

## 2023-11-12 NOTE — Addendum Note (Signed)
 Addended by: Jin Shockley on: 11/12/2023 10:19 AM   Modules accepted: Orders

## 2023-11-12 NOTE — Telephone Encounter (Addendum)
 Spoke with Holley, of [facility], to get clarification of which vaccine insurance will cover. She states Tdap vaccine should be covered.   E-scribed Tdap rx. Unpinned Td request.   Spoke with pt notifying him rx was sent in. Pt expresses his thanks.

## 2023-11-12 NOTE — Telephone Encounter (Unsigned)
 Copied from CRM [id number]. Topic: Clinical - Prescription Issue >> Nov 12, 2023 12:23 PM Rea ORN wrote: Reason for CRM: Lorn with [facility] called to advise that they received a Tdap rx for pt but they only have 5-2.5-18.5 MCG in stock, not what was ordered. Mandy asked if they can have a verbal to change dose or if PCP wants the dose that was prescribed.   Please call back [phone] and ask to speak with the pharmacist.

## 2023-11-13 MED ORDER — TETANUS-DIPHTH-ACELL PERTUSSIS 5-2.5-18.5 LF-MCG/0.5 IM SUSP: 0.5000 mL | Freq: Once | INTRAMUSCULAR | 0 refills | Status: AC

## 2023-11-13 NOTE — Telephone Encounter (Addendum)
 New Tdap (Boostrix) sent to pharmacy.

## 2023-11-13 NOTE — Progress Notes (Signed)
 KEATH MATERA                                          MRN: [id number]   11/13/2023   The VBCI Quality Team Specialist reviewed this patient medical record for the purposes of chart review for care gap closure. The following were reviewed: abstraction for care gap closure-kidney health evaluation for diabetes:eGFR  and uACR.    VBCI Quality Team

## 2023-11-13 NOTE — Addendum Note (Signed)
 Addended by: RILLA BALLER on: 11/13/2023 12:07 AM   Modules accepted: Orders

## 2023-11-24 ENCOUNTER — Telehealth: Payer: Self-pay | Admitting: Pharmacist

## 2023-11-24 ENCOUNTER — Telehealth: Payer: Self-pay

## 2023-11-24 DIAGNOSIS — E1169 Type 2 diabetes mellitus with other specified complication: Secondary | ICD-10-CM

## 2023-11-24 NOTE — Progress Notes (Signed)
 Brief Telephone Documentation Reason for Call: Patient left message regarding question for pharmacist about PAP  Summary of Call: Patient received letter in the mail regarding [employer] changes for 2025 including removal of Ozempic /Rybelsus   Patient notes he has a Medicare contact who he has spoken with recently regarding looking at different plans for 2026 that may offer better coverage. He reports this contact told him to ask PCP if [place] has better medication coverage than BCBS?  Reviewed with patient that there are hundreds of Medicare plans to choose from, including multiple plans though various different carriers. Discussed Medicare.gov resource as well at [employer] to help sort through different plan options.   Patient mailed a summary of recommendations for resources/questions to ask regarding insurance plans for next year:   Choosing a Medicare Plan  With Medicare's Annual Enrollment Period starting on October 15th, we encourage you to review formulary coverage and copay amounts for Ozempic  or Rybelsus , as costs can vary widely between plans. Starting on Wednesday, October 1st you can compare your Medicare plan options by going to the Plan Finder tool at [employer].gov ( TeleconferenceOnDemand.fr ).   There are Medicare Specialists through the [place] [facility] Insurance Information Program ([place]) that can help you shop for Medicare plans.   [place] [facility] toll free number: [phone] (Monday - Friday 8 am - 5 pm)  There are representatives located in each county:   [place] [facility] [address]     [place]  [address] [phone] Call for an appointment      Considerations to possibly lower drug costs through your Medicare plan: Avoid plans with unaffordable deductibles. Many plans will have a deductible, though they can range from $0 to over $900. This means your insurance will not help pay for brand  medication until you spend your deductible amount at the beginning of the year.  Instead of medication coinsurance look for a plan with medication copay. A copay means you pay a set amount for all preferred brand meds. Many plans for example will offer brand meds for a ~$47 copay each month and sometimes lower and this cost should not change for the year.  A coinsurance means you pay a percentage of the drug cost each month. This typically results in brand medications costing several hundred dollars per month all year long.      Manuelita FABIENE Kobs, PharmD Clinical Pharmacist [facility] [phone]

## 2023-11-24 NOTE — Telephone Encounter (Signed)
 Copied from CRM [id number]. Topic: General - Other >> Nov 24, 2023  9:28 AM Rosina BIRCH wrote: Reason for CRM: patient would like the pharmacist to give him a call in regards to ozempic  [phone]

## 2023-12-17 ENCOUNTER — Telehealth: Payer: Self-pay | Admitting: Pharmacist

## 2023-12-17 ENCOUNTER — Telehealth: Payer: Self-pay

## 2023-12-17 NOTE — Progress Notes (Signed)
 Brief Telephone Documentation Reason for Call: Patient left message regarding question for pharmacist regarding Ozempic  2026  Summary of Call: Returned patient call. Per out last conversation, he spoke with his Medicare guy and he recommended [employer] for 2026 regarding better overall coverage and better drug benefit.   Plan recommended by Medicare contact = [employer] Gold Plus  Consideration: Reviewed plan details with patient from [facility] 2026 website including drug benefits.  $250 deductible (Rx) then Tier 3 (preferred brand) = $47/month.  Patient feels this is affordable for him, significantly better coverage than current BCBS plan.   Patient has no further questions. Will speak again with his medicare contact.  Advised patient to reach out if any other questions/concerns.

## 2023-12-17 NOTE — Telephone Encounter (Signed)
 Copied from CRM #[id number]. Topic: Clinical - Medication Question >> Dec 17, 2023  3:03 PM Taleah C wrote: Reason for CRM: pt called and asked to speak with Logan Tanner the Pharmacist regarding his Ozempic . He wants to discuss which method would be best for him to receive his meds based on his insurance. Please call and advise.

## 2024-01-21 ENCOUNTER — Encounter: Payer: Self-pay | Admitting: Internal Medicine

## 2024-01-25 ENCOUNTER — Other Ambulatory Visit: Payer: Self-pay | Admitting: Family Medicine

## 2024-01-26 NOTE — Telephone Encounter (Signed)
 Requesting: Robaxin  Contract: No UDS:  Last Visit: 09/21/2023 Next Visit: 03/18/2024 Last Refill: 09/15/23  Please Advise

## 2024-02-15 ENCOUNTER — Ambulatory Visit: Admission: EM | Admit: 2024-02-15 | Discharge: 2024-02-15 | Disposition: A | Discharge status: Home / Self Care

## 2024-02-15 ENCOUNTER — Ambulatory Visit: Payer: Self-pay

## 2024-02-15 DIAGNOSIS — K439 Ventral hernia without obstruction or gangrene: Secondary | ICD-10-CM

## 2024-02-15 DIAGNOSIS — Z7729 Contact with and (suspected ) exposure to other hazardous substances: Secondary | ICD-10-CM | POA: Insufficient documentation

## 2024-02-15 DIAGNOSIS — H903 Sensorineural hearing loss, bilateral: Secondary | ICD-10-CM | POA: Insufficient documentation

## 2024-02-15 DIAGNOSIS — Z461 Encounter for fitting and adjustment of hearing aid: Secondary | ICD-10-CM | POA: Insufficient documentation

## 2024-02-15 DIAGNOSIS — H918X3 Other specified hearing loss, bilateral: Secondary | ICD-10-CM | POA: Insufficient documentation

## 2024-02-15 NOTE — Telephone Encounter (Signed)
 FYI Only or Action Required?: FYI only for provider: appointment scheduled on 1/9.  Patient was last seen in primary care on 09/15/2023 by Rilla Baller, MD.  Called Nurse Triage reporting [place].  Symptoms began several days ago.  Interventions attempted: Nothing.  Symptoms are: unchanged.  Triage Disposition: See PCP When Office is Open (Within 3 Days)  Patient/caregiver understands and will follow disposition?: No, wishes to speak with PCP  Copied from CRM #[id number]. Topic: Clinical - Red Word Triage >> Feb 15, 2024  8:29 AM Mesmerise C wrote: [facility] that prompted transfer to Nurse Triage: Patient believes he has a hernia right below his rib cage noticed it on Saturday states it's sore to the touch but when he coughs there's pain Reason for Disposition  [1] New-onset hernia suspected (reducible bulge in groin or abdomen; non-tender) AND [2] NO pain or vomiting  Answer Assessment - Initial Assessment Questions 1. APPEARANCE of SWELLING: What does it look like?     Lump under skin 2. SIZE: How large is the swelling? (e.g., inches, cm; or compare to size of pinhead, tip of pen, eraser, coin, pea, grape, ping pong ball)      Golf ball size 3. LOCATION: Where is the swelling located?     R side below rib 4. ONSET: When did the swelling start?     This weekend 5. COLOR: What color is it? Is there more than one color?     denies 6. PAIN: Is there any pain? If Yes, ask: How bad is the pain? (Scale 1-10; or mild, moderate, severe)       Worse with certain movement, including coughing, moderate at times coughing makes worse 7. ITCH: Does it itch? If Yes, ask: How bad is the itch?      denies 8. CAUSE: What do you think caused the swelling?     hernia 9 OTHER SYMPTOMS: Do you have any other symptoms? (e.g., fever)     Denies  Pt offered appt on 1/7, declined d/t work schedule.  Protocols used: Skin Lump or Localized Swelling-A-AH

## 2024-02-15 NOTE — Discharge Instructions (Addendum)
 As we discussed, you have a ventral hernia.  Avoid any lifting over 5 pounds and avoid any straining which could worsen the hernia or increase your pain.  You may use over-the-counter Tylenol and/or ibuprofen as needed for pain.  You may also wear an abdominal binder, which you can purchase from [facility], to help support your abdomen and help alleviate pain as a result of the hernia.  If the knot under abdomen becomes more swollen, tender to touch, hard, or you start running fevers you need to go to the ER for evaluation.

## 2024-02-15 NOTE — Telephone Encounter (Signed)
Noted. will see then.
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
## 2024-02-15 NOTE — ED Triage Notes (Addendum)
 Pt c/o knot in middle of abd that he noticed Saturday. Concerned for hernia. States pain comes & goes. Denies any N/V. Has PCP appt on Friday.

## 2024-02-19 ENCOUNTER — Encounter: Payer: Self-pay | Admitting: Family Medicine

## 2024-02-19 ENCOUNTER — Ambulatory Visit: Admitting: Family Medicine

## 2024-02-19 VITALS — BP 140/64 | HR 84 | Temp 98.4°F | Ht 65.5 in | Wt 184.2 lb

## 2024-02-19 DIAGNOSIS — F17200 Nicotine dependence, unspecified, uncomplicated: Secondary | ICD-10-CM | POA: Diagnosis not present

## 2024-02-19 DIAGNOSIS — Z23 Encounter for immunization: Secondary | ICD-10-CM | POA: Diagnosis not present

## 2024-02-19 DIAGNOSIS — K439 Ventral hernia without obstruction or gangrene: Secondary | ICD-10-CM | POA: Diagnosis not present

## 2024-02-19 DIAGNOSIS — R030 Elevated blood-pressure reading, without diagnosis of hypertension: Secondary | ICD-10-CM

## 2024-02-19 NOTE — Patient Instructions (Addendum)
 Flu shot today I will refer you to surgeons in [facility] surgery) for evaluation of ventral hernia.  Could wear abdominal binder preventatively.  Avoid heavy lifting/straining

## 2024-02-19 NOTE — Assessment & Plan Note (Signed)
 He has been eating more frozen meals. Rec back off salt/sodium in diet DASH diet handout provided Reassess at CPE next month.

## 2024-02-19 NOTE — Progress Notes (Unsigned)
 " Ph: [phone] Fax: [phone]   Patient ID: Logan Tanner, male    DOB: 1955-09-17, 69 y.o.   MRN: [id number]  This visit was conducted in person.  BP (!) 140/64 (BP Location: Right Arm, Cuff Size: Large)   Pulse 84   Temp 98.4 F (36.9 C) (Oral)   Ht 5' 5.5 (1.664 m)   Wt 184 lb 3.2 oz (83.6 kg)   SpO2 98%   BMI 30.19 kg/m    CC: recheck hernia  Subjective:   HPI: Logan Tanner is a 69 y.o. male presenting on 02/19/2024 for Acute Visit (Pt has abdominal hernia, seen at urgent care 02/15/24//Pt states they mashed on it twice at [facility], it hurt real bad while they did it and then all the pain went away and it's not sticking out at all anymore./States there is still a tiny knot there but nothing like there was before./)   Seen 02/15/2024 at [facility] dx with ventral hernia that actually has improved after physical exam at [facility] - thinks he pushed it back in.   Noted knot after working on rental house. He was painting and sanding, lots of crawling and up and down, coughing. Doesn't remember lifting anything heavy or any major exertion. Pain worse when putting pressure on it.   No fevers/chills, nausea, vomiting, bowel changes.  Continues smoking. Precontemplative.    Known diabetic on ozempic  0.5mg  weekly, metformin  1000mg  bid, and actos  30mg  daily     Relevant past medical, surgical, family and social history reviewed and updated as indicated. Interim medical history since our last visit reviewed. Allergies and medications reviewed and updated. Outpatient Medications Prior to Visit  Medication Sig Dispense Refill   Coenzyme Q10 (CO Q 10) 100 MG CAPS Take 1 capsule by mouth daily. 30 capsule    cyanocobalamin  (VITAMIN B12) 1000 MCG tablet Take 1 tablet (1,000 mcg total) by mouth every Monday, Wednesday, and Friday.     meclizine  (ANTIVERT ) 25 MG tablet Take 1 tablet (25 mg total) by mouth 2 (two) times daily as needed for dizziness (sedating). 30 tablet 1   metFORMIN   (GLUCOPHAGE ) 1000 MG tablet Take 1 tablet (1,000 mg total) by mouth 2 (two) times daily with a meal. 180 tablet 3   methocarbamol  (ROBAXIN ) 500 MG tablet TAKE 1 TABLET BY MOUTH EVERY 8 HOURS AS NEEDED FOR MUSCLE SPASMS 40 tablet 1   naproxen sodium (ANAPROX) 220 MG tablet Take 220 mg by mouth as needed.     omeprazole  (PRILOSEC) 40 MG capsule Take 1 capsule (40 mg total) by mouth daily as needed. 90 capsule 3   pioglitazone  (ACTOS ) 30 MG tablet Take 1 tablet (30 mg total) by mouth daily. 90 tablet 3   Semaglutide ,0.25 or 0.5MG /DOS, (OZEMPIC , 0.25 OR 0.5 MG/DOSE,) 2 MG/3ML SOPN Inject 0.5 mg as directed once a week. Via [employer] PAP. 9 mL 3   simvastatin  (ZOCOR ) 20 MG tablet Take 1 tablet (20 mg total) by mouth every other day. 45 tablet 3   No facility-administered medications prior to visit.     Per HPI unless specifically indicated in ROS section below Review of Systems  Objective:  BP (!) 140/64 (BP Location: Right Arm, Cuff Size: Large)   Pulse 84   Temp 98.4 F (36.9 C) (Oral)   Ht 5' 5.5 (1.664 m)   Wt 184 lb 3.2 oz (83.6 kg)   SpO2 98%   BMI 30.19 kg/m   Wt Readings from Last 3 Encounters:  02/19/24  184 lb 3.2 oz (83.6 kg)  02/15/24 184 lb 14.4 oz (83.9 kg)  09/15/23 179 lb 2 oz (81.3 kg)      Physical Exam Vitals and nursing note reviewed.  Constitutional:      Appearance: Normal appearance. He is not ill-appearing.  Abdominal:     General: Bowel sounds are normal. There is no distension.     Palpations: Abdomen is soft. There is no mass.     Tenderness: There is no abdominal tenderness. There is no guarding or rebound.     Hernia: A hernia is present.      Comments: Large ventral defect without tenderness or mass  Neurological:     Mental Status: He is alert.  Psychiatric:        Mood and Affect: Mood normal.        Behavior: Behavior normal.       Results for orders placed or performed in visit on 09/15/23  Hemoglobin A1c   Collection Time: 09/15/23   9:46 AM  Result Value Ref Range   Hgb A1c MFr Bld 7.1 (H) 4.6 - 6.5 %  Microalbumin / creatinine urine ratio   Collection Time: 09/15/23  9:46 AM  Result Value Ref Range   Microalb, Ur 1.0 0.0 - 1.9 mg/dL   Creatinine,U 63.2 mg/dL   Microalb Creat Ratio 26.0 0.0 - 30.0 mg/g  Lipid panel   Collection Time: 09/15/23  9:46 AM  Result Value Ref Range   Cholesterol 152 0 - 200 mg/dL   Triglycerides 12.9 0.0 - 149.0 mg/dL   HDL 43.19 >60.99 mg/dL   VLDL 82.5 0.0 - 59.9 mg/dL   LDL Cholesterol 78 0 - 99 mg/dL   Total CHOL/HDL Ratio 3    NonHDL 94.98   Comprehensive metabolic panel with GFR   Collection Time: 09/15/23  9:46 AM  Result Value Ref Range   Sodium 140 135 - 145 mEq/L   Potassium 5.2 (H) 3.5 - 5.1 mEq/L   Chloride 106 96 - 112 mEq/L   CO2 30 19 - 32 mEq/L   Glucose, Bld 124 (H) 70 - 99 mg/dL   BUN 10 6 - 23 mg/dL   Creatinine, Ser 9.28 0.40 - 1.50 mg/dL   Total Bilirubin 0.4 0.2 - 1.2 mg/dL   Alkaline Phosphatase 60 39 - 117 U/L   AST 14 0 - 37 U/L   ALT 21 0 - 53 U/L   Total Protein 6.8 6.0 - 8.3 g/dL   Albumin 4.3 3.5 - 5.2 g/dL   GFR 03.86 >39.99 mL/min   Calcium 9.6 8.4 - 10.5 mg/dL  PSA   Collection Time: 09/15/23  9:46 AM  Result Value Ref Range   PSA 1.53 0.10 - 4.00 ng/mL  Vitamin B12   Collection Time: 09/15/23  9:46 AM  Result Value Ref Range   Vitamin B-12 717 211 - 911 pg/mL  CBC with Differential/Platelet   Collection Time: 09/15/23  9:46 AM  Result Value Ref Range   WBC 6.5 4.0 - 10.5 K/uL   RBC 4.62 4.22 - 5.81 Mil/uL   Hemoglobin 14.3 13.0 - 17.0 g/dL   HCT 57.1 60.9 - 47.9 %   MCV 92.6 78.0 - 100.0 fl   MCHC 33.5 30.0 - 36.0 g/dL   RDW 86.4 88.4 - 84.4 %   Platelets 362.0 150.0 - 400.0 K/uL   Neutrophils Relative % 56.0 43.0 - 77.0 %   Lymphocytes Relative 29.9 12.0 - 46.0 %   Monocytes Relative 6.3  3.0 - 12.0 %   Eosinophils Relative 6.8 (H) 0.0 - 5.0 %   Basophils Relative 1.0 0.0 - 3.0 %   Neutro Abs 3.6 1.4 - 7.7 K/uL   Lymphs Abs  1.9 0.7 - 4.0 K/uL   Monocytes Absolute 0.4 0.1 - 1.0 K/uL   Eosinophils Absolute 0.4 0.0 - 0.7 K/uL   Basophils Absolute 0.1 0.0 - 0.1 K/uL  TSH   Collection Time: 09/15/23  9:46 AM  Result Value Ref Range   TSH 1.64 0.35 - 5.50 uIU/mL  POCT Urinalysis Dipstick (Automated)   Collection Time: 09/15/23  9:53 AM  Result Value Ref Range   Color, UA yellow    Clarity, UA clear    Glucose, UA Negative Negative   Bilirubin, UA negative    Ketones, UA negative    Spec Grav, UA 1.010 1.010 - 1.025   Blood, UA negative    pH, UA 6.0 5.0 - 8.0   Protein, UA Negative Negative   Urobilinogen, UA 0.2 0.2 or 1.0 E.U./dL   Nitrite, UA negative    Leukocytes, UA Negative Negative    Assessment & Plan:   Problem List Items Addressed This Visit     Smoker   Continue to encourage cessation Upcoming lung cancer screening CT scheduled for next week.       Ventral hernia - Primary   Describes episode of ventral hernia that has subsequently resolved on its own.  Discussed avoiding heavy straining/lifting and abdominal binder use.  Reasonable for surgical consult - referral placed.       Elevated blood pressure reading without diagnosis of hypertension   He has been eating more frozen meals. Rec back off salt/sodium in diet DASH diet handout provided Reassess at CPE next month.       Other Visit Diagnoses       Encounter for immunization       Relevant Orders   Flu vaccine HIGH DOSE PF(Fluzone Trivalent) (Completed)        No orders of the defined types were placed in this encounter.   Orders Placed This Encounter  Procedures   Flu vaccine HIGH DOSE PF(Fluzone Trivalent)    Patient Instructions  Flu shot today I will refer you to surgeons in [facility] surgery) for evaluation of ventral hernia.  Could wear abdominal binder preventatively.  Avoid heavy lifting/straining  Follow up plan: Return if symptoms worsen or fail to improve.  Anton Blas,  MD   "

## 2024-02-19 NOTE — Assessment & Plan Note (Signed)
 Continue to encourage cessation Upcoming lung cancer screening CT scheduled for next week.

## 2024-02-19 NOTE — Assessment & Plan Note (Signed)
 Describes episode of ventral hernia that has subsequently resolved on its own.  Discussed avoiding heavy straining/lifting and abdominal binder use.  Reasonable for surgical consult - referral placed.

## 2024-02-25 ENCOUNTER — Encounter: Payer: Self-pay | Admitting: Internal Medicine

## 2024-02-26 ENCOUNTER — Ambulatory Visit
Admission: RE | Admit: 2024-02-26 | Discharge: 2024-02-26 | Disposition: A | Discharge status: Home / Self Care | Payer: Self-pay | Source: Ambulatory Visit | Attending: Acute Care | Admitting: Acute Care

## 2024-02-26 DIAGNOSIS — Z87891 Personal history of nicotine dependence: Secondary | ICD-10-CM | POA: Insufficient documentation

## 2024-02-26 DIAGNOSIS — Z122 Encounter for screening for malignant neoplasm of respiratory organs: Secondary | ICD-10-CM | POA: Diagnosis present

## 2024-02-26 DIAGNOSIS — F17210 Nicotine dependence, cigarettes, uncomplicated: Secondary | ICD-10-CM | POA: Diagnosis present

## 2024-02-29 ENCOUNTER — Ambulatory Visit

## 2024-02-29 VITALS — Ht 65.5 in | Wt 180.0 lb

## 2024-02-29 DIAGNOSIS — Z860100 Personal history of colon polyps, unspecified: Secondary | ICD-10-CM

## 2024-02-29 NOTE — Progress Notes (Signed)
 PCP MD at time of PV: Rilla Baller, MD  __________________________________________________________________________________________________________________________________________  No egg allergy known to patient  No soy allergy known to patient No issues known to pt with past sedation with any surgeries or procedures Patient denies ever being told they had issues or difficulty with intubation  No FH of Malignant Hyperthermia Pt is not on diet pills Pt is not on  home 02  Pt is not on blood thinners  No A fib or A flutter Have any cardiac testing pending-- no  LOA: independent  No Chew or Snuff tobacco __________________________________________________________________________________________________________________________________________  Constipation: no  Prep: spilt dose miralax  __________________________________________________________________________________________________________________________________________  PV completed with patient. Prep instructions reviewed and provided during apt. Rx sent to preferred pharmacy.  __________________________________________________________________________________________________________________________________________  Patient's chart reviewed by Norleen Schillings CNRA prior to previsit and patient appropriate for the [facility].  Previsit completed and red dot placed by patient's name on their procedure day (on provider's schedule).

## 2024-03-02 ENCOUNTER — Encounter: Payer: Self-pay | Admitting: Internal Medicine

## 2024-03-08 ENCOUNTER — Telehealth: Payer: Self-pay | Admitting: *Deleted

## 2024-03-08 DIAGNOSIS — Z87891 Personal history of nicotine dependence: Secondary | ICD-10-CM

## 2024-03-08 DIAGNOSIS — R911 Solitary pulmonary nodule: Secondary | ICD-10-CM

## 2024-03-08 NOTE — Telephone Encounter (Signed)
 Spoke to patient and reviewed lung screening CT results. New small nodule noted with recommendation to repeat CT in 6 months. PT verbalized understanding and is aware we will call him closer to 6 months to schedule. Results/ plans faxed to PCP.

## 2024-03-09 ENCOUNTER — Encounter: Payer: Self-pay | Admitting: *Deleted

## 2024-03-14 ENCOUNTER — Encounter: Admitting: Gastroenterology

## 2024-03-18 ENCOUNTER — Encounter: Payer: Self-pay | Admitting: Family Medicine

## 2024-03-18 ENCOUNTER — Ambulatory Visit: Admitting: Family Medicine

## 2024-03-18 VITALS — BP 138/86 | HR 81 | Temp 98.1°F | Ht 65.5 in | Wt 180.6 lb

## 2024-03-18 DIAGNOSIS — F17200 Nicotine dependence, unspecified, uncomplicated: Secondary | ICD-10-CM

## 2024-03-18 DIAGNOSIS — E1169 Type 2 diabetes mellitus with other specified complication: Secondary | ICD-10-CM

## 2024-03-18 DIAGNOSIS — R030 Elevated blood-pressure reading, without diagnosis of hypertension: Secondary | ICD-10-CM

## 2024-03-18 DIAGNOSIS — K439 Ventral hernia without obstruction or gangrene: Secondary | ICD-10-CM

## 2024-03-18 DIAGNOSIS — M25511 Pain in right shoulder: Secondary | ICD-10-CM | POA: Insufficient documentation

## 2024-03-18 LAB — POCT GLYCOSYLATED HEMOGLOBIN (HGB A1C): Hemoglobin A1C: 6.5 % — AB (ref 4.0–5.6)

## 2024-03-18 NOTE — Assessment & Plan Note (Addendum)
 Encouraged full cessation Reviewed latest lung cancer screening CT results

## 2024-03-18 NOTE — Assessment & Plan Note (Signed)
 Forgot referral last visit - new referral placed today

## 2024-03-18 NOTE — Assessment & Plan Note (Addendum)
 Chronic, stable period on current regimen - continue

## 2024-03-18 NOTE — Patient Instructions (Addendum)
 Good to see you today.  Continue current medicines.  Return as needed or in 6 months for physical  We will refer you to surgeon for evaluation.

## 2024-03-18 NOTE — Assessment & Plan Note (Signed)
 BP again above goal. Reviewed limiting dietary salt/sodium levels, caffeine.  Continue to monitor BP at home, let us  know if consistently >140/90.

## 2024-03-18 NOTE — Assessment & Plan Note (Signed)
 Not consistent with PMR Anticipate OA related r/o RTC tendinopathy.  ROM preserved He will continue tylenol/aleve PRN.

## 2024-03-18 NOTE — Progress Notes (Addendum)
 " Ph: [phone] Fax: [phone]   Patient ID: Logan Tanner, male    DOB: 01-Jan-1956, 69 y.o.   MRN: [id number]  This visit was conducted in person.  BP 138/86 (Cuff Size: Normal)   Pulse 81   Temp 98.1 F (36.7 C) (Oral)   Ht 5' 5.5 (1.664 m)   Wt 180 lb 9.6 oz (81.9 kg)   SpO2 98%   BMI 29.60 kg/m   140s/80s on repeat testing   CC: 6 mo f/u visit  Subjective:   HPI: Logan Tanner is a 69 y.o. male presenting on 03/18/2024 for Medical Management of Chronic Issues (6 month FU/Nocturia, pt take diuretic 1 in morning and night/) He is not on diuretic.    Ventral hernia - pending surgical referral. Referral placed today.   Bilateral shoulder pain - seeing chiropractor. Shoulders wake him up at night. He manages with tylenol and aleve. No stiffness. Notes bone pain. ROM preserved. He wants to avoid surgery. Will let me know if interested in ortho referral.   DM - does occ regularly check sugars. Compliant with antihyperglycemic regimen which includes: metformin  1000mg  bid, actos  30mg  daily, ozempic  0.5mg  weekly. Rare low sugars / hypoglycemic symptoms. Denies paresthesias, blurry vision. Chronic LE numbness attributed to back issues. Last diabetic eye exam last month with Dr Carolee in [place] - records requested. Glucometer brand: [employer]. Last foot exam: 05/2023. DSME: declined. Lab Results  Component Value Date   HGBA1C 6.5 (A) 03/18/2024   Diabetic Foot Exam - Simple   Simple Foot Form Diabetic Foot exam was performed with the following findings: Yes 03/18/2024  7:57 AM  Visual Inspection No deformities, no ulcerations, no other skin breakdown bilaterally: Yes Sensation Testing Intact to touch and monofilament testing bilaterally: Yes Pulse Check Posterior Tibialis and Dorsalis pulse intact bilaterally: Yes Comments No claudication    Lab Results  Component Value Date   MICROALBUR 1.0 09/15/2023   Continued smoker 1ppd. Recent lung cancer screening CT  reviewed.      Relevant past medical, surgical, family and social history reviewed and updated as indicated. Interim medical history since our last visit reviewed. Allergies and medications reviewed and updated. Outpatient Medications Prior to Visit  Medication Sig Dispense Refill   Coenzyme Q10 (CO Q 10) 100 MG CAPS Take 1 capsule by mouth daily. 30 capsule    cyanocobalamin  (VITAMIN B12) 1000 MCG tablet Take 1 tablet (1,000 mcg total) by mouth every Monday, Wednesday, and Friday.     meclizine  (ANTIVERT ) 25 MG tablet Take 1 tablet (25 mg total) by mouth 2 (two) times daily as needed for dizziness (sedating). 30 tablet 1   metFORMIN  (GLUCOPHAGE ) 1000 MG tablet Take 1 tablet (1,000 mg total) by mouth 2 (two) times daily with a meal. 180 tablet 3   methocarbamol  (ROBAXIN ) 500 MG tablet TAKE 1 TABLET BY MOUTH EVERY 8 HOURS AS NEEDED FOR MUSCLE SPASMS 40 tablet 1   omeprazole  (PRILOSEC) 40 MG capsule Take 1 capsule (40 mg total) by mouth daily as needed. 90 capsule 3   pioglitazone  (ACTOS ) 30 MG tablet Take 1 tablet (30 mg total) by mouth daily. 90 tablet 3   Semaglutide ,0.25 or 0.5MG /DOS, (OZEMPIC , 0.25 OR 0.5 MG/DOSE,) 2 MG/3ML SOPN Inject 0.5 mg as directed once a week. Via [employer] PAP. 9 mL 3   simvastatin  (ZOCOR ) 20 MG tablet Take 1 tablet (20 mg total) by mouth every other day. 45 tablet 3   naproxen sodium (ANAPROX) 220 MG tablet  Take 220 mg by mouth as needed. (Patient not taking: Reported on 03/18/2024)     No facility-administered medications prior to visit.     Per HPI unless specifically indicated in ROS section below Review of Systems  Objective:  BP 138/86 (Cuff Size: Normal)   Pulse 81   Temp 98.1 F (36.7 C) (Oral)   Ht 5' 5.5 (1.664 m)   Wt 180 lb 9.6 oz (81.9 kg)   SpO2 98%   BMI 29.60 kg/m   Wt Readings from Last 3 Encounters:  03/18/24 180 lb 9.6 oz (81.9 kg)  02/29/24 180 lb (81.6 kg)  02/26/24 184 lb (83.5 kg)      Physical Exam Vitals and nursing note  reviewed.  Constitutional:      Appearance: Normal appearance. He is not ill-appearing.  HENT:     Head: Normocephalic and atraumatic.     Mouth/Throat:     Mouth: Mucous membranes are moist.     Pharynx: Oropharynx is clear. No oropharyngeal exudate or posterior oropharyngeal erythema.  Eyes:     Extraocular Movements: Extraocular movements intact.     Conjunctiva/sclera: Conjunctivae normal.     Pupils: Pupils are equal, round, and reactive to light.  Cardiovascular:     Rate and Rhythm: Normal rate and regular rhythm.     Pulses: Normal pulses.     Heart sounds: Normal heart sounds. No murmur heard. Pulmonary:     Effort: Pulmonary effort is normal. No respiratory distress.     Breath sounds: Normal breath sounds. No wheezing, rhonchi or rales.  Musculoskeletal:     Right lower leg: No edema.     Left lower leg: No edema.     Comments: See HPI for foot exam if done  Skin:    General: Skin is warm and dry.     Findings: No rash.  Neurological:     Mental Status: He is alert.  Psychiatric:        Mood and Affect: Mood normal.        Behavior: Behavior normal.       Results for orders placed or performed in visit on 03/18/24  HgB A1c   Collection Time: 03/18/24  7:39 AM  Result Value Ref Range   Hemoglobin A1C 6.5 (A) 4.0 - 5.6 %   HbA1c POC (<> result, manual entry)     HbA1c, POC (prediabetic range)     HbA1c, POC (controlled diabetic range)     Lab Results  Component Value Date   PSA 1.53 09/15/2023   PSA 1.44 11/14/2022   PSA 1.66 07/05/2021     Assessment & Plan:   Problem List Items Addressed This Visit     Type 2 diabetes mellitus with other specified complication (HCC) - Primary   Chronic, stable period on current regimen - continue.       Relevant Orders   HgB A1c (Completed)   Smoker   Encouraged full cessation Reviewed latest lung cancer screening CT results      Ventral hernia   Forgot referral last visit - new referral placed today        Relevant Orders   Ambulatory referral to General Surgery   Elevated blood pressure reading without diagnosis of hypertension   BP again above goal. Reviewed limiting dietary salt/sodium levels, caffeine.  Continue to monitor BP at home, let us  know if consistently >140/90.       Bilateral shoulder pain   Not consistent with PMR Anticipate OA  related r/o RTC tendinopathy.  ROM preserved He will continue tylenol/aleve PRN.         No orders of the defined types were placed in this encounter.   Orders Placed This Encounter  Procedures   Ambulatory referral to General Surgery    Referral Priority:   Routine    Referral Type:   Surgical    Referral Reason:   Specialty Services Required    Requested Specialty:   General Surgery    Number of Visits Requested:   1   HgB A1c    Patient Instructions  Good to see you today.  Continue current medicines.  Return as needed or in 6 months for physical  We will refer you to surgeon for evaluation.   Follow up plan: Return in about 6 months (around 09/15/2024) for annual exam, prior fasting for blood work.  Anton Blas, MD   "

## 2024-04-08 ENCOUNTER — Encounter: Admitting: Internal Medicine
